# Patient Record
Sex: Female | Born: 1970
Health system: Southern US, Community
[De-identification: ages and names within clinical notes are randomized; demographics above are authoritative.]

## PROBLEM LIST (undated history)

## (undated) DIAGNOSIS — E669 Obesity, unspecified: Secondary | ICD-10-CM

## (undated) DIAGNOSIS — I10 Essential (primary) hypertension: Secondary | ICD-10-CM

## (undated) DIAGNOSIS — I639 Cerebral infarction, unspecified: Secondary | ICD-10-CM

## (undated) HISTORY — DX: Cerebral infarction, unspecified: I63.9

## (undated) HISTORY — PX: TUBAL LIGATION: SHX77

## (undated) HISTORY — PX: NOVASURE ABLATION: SHX5394

## (undated) HISTORY — DX: Obesity, unspecified: E66.9

---

## 1997-06-08 ENCOUNTER — Encounter (HOSPITAL_COMMUNITY): Admission: RE | Admit: 1997-06-08 | Discharge: 1997-09-06 | Payer: Self-pay | Admitting: *Deleted

## 1997-06-22 ENCOUNTER — Inpatient Hospital Stay (HOSPITAL_COMMUNITY): Admission: AD | Admit: 1997-06-22 | Discharge: 1997-06-25 | Payer: Self-pay | Admitting: Obstetrics

## 1997-09-07 ENCOUNTER — Encounter (HOSPITAL_COMMUNITY): Admission: RE | Admit: 1997-09-07 | Discharge: 1997-11-29 | Payer: Self-pay | Admitting: *Deleted

## 1997-11-03 ENCOUNTER — Inpatient Hospital Stay (HOSPITAL_COMMUNITY): Admission: AD | Admit: 1997-11-03 | Discharge: 1997-11-03 | Payer: Self-pay | Admitting: Obstetrics

## 1997-11-12 ENCOUNTER — Ambulatory Visit (HOSPITAL_COMMUNITY): Admission: RE | Admit: 1997-11-12 | Discharge: 1997-11-12 | Payer: Self-pay | Admitting: *Deleted

## 1997-11-27 ENCOUNTER — Inpatient Hospital Stay (HOSPITAL_COMMUNITY): Admission: AD | Admit: 1997-11-27 | Discharge: 1997-11-30 | Payer: Self-pay | Admitting: *Deleted

## 1998-01-11 ENCOUNTER — Inpatient Hospital Stay (HOSPITAL_COMMUNITY): Admission: AD | Admit: 1998-01-11 | Discharge: 1998-01-11 | Payer: Self-pay | Admitting: *Deleted

## 1998-04-27 ENCOUNTER — Inpatient Hospital Stay (HOSPITAL_COMMUNITY): Admission: AD | Admit: 1998-04-27 | Discharge: 1998-04-27 | Payer: Self-pay | Admitting: Obstetrics

## 1998-10-25 ENCOUNTER — Encounter (INDEPENDENT_AMBULATORY_CARE_PROVIDER_SITE_OTHER): Payer: Self-pay

## 1998-10-25 ENCOUNTER — Inpatient Hospital Stay (HOSPITAL_COMMUNITY): Admission: AD | Admit: 1998-10-25 | Discharge: 1998-10-25 | Payer: Self-pay | Admitting: *Deleted

## 1999-01-10 ENCOUNTER — Emergency Department (HOSPITAL_COMMUNITY): Admission: EM | Admit: 1999-01-10 | Discharge: 1999-01-10 | Payer: Self-pay | Admitting: Emergency Medicine

## 1999-05-18 ENCOUNTER — Emergency Department (HOSPITAL_COMMUNITY): Admission: EM | Admit: 1999-05-18 | Discharge: 1999-05-18 | Payer: Self-pay | Admitting: Emergency Medicine

## 1999-06-15 ENCOUNTER — Emergency Department (HOSPITAL_COMMUNITY): Admission: EM | Admit: 1999-06-15 | Discharge: 1999-06-15 | Payer: Self-pay

## 1999-07-13 ENCOUNTER — Emergency Department (HOSPITAL_COMMUNITY): Admission: EM | Admit: 1999-07-13 | Discharge: 1999-07-13 | Payer: Self-pay | Admitting: Emergency Medicine

## 2000-01-26 ENCOUNTER — Encounter (INDEPENDENT_AMBULATORY_CARE_PROVIDER_SITE_OTHER): Payer: Self-pay | Admitting: Specialist

## 2000-01-26 ENCOUNTER — Inpatient Hospital Stay (HOSPITAL_COMMUNITY): Admission: AD | Admit: 2000-01-26 | Discharge: 2000-01-26 | Payer: Self-pay | Admitting: *Deleted

## 2000-02-22 ENCOUNTER — Inpatient Hospital Stay (HOSPITAL_COMMUNITY): Admission: AD | Admit: 2000-02-22 | Discharge: 2000-02-22 | Payer: Self-pay | Admitting: Obstetrics & Gynecology

## 2000-03-23 ENCOUNTER — Emergency Department (HOSPITAL_COMMUNITY): Admission: EM | Admit: 2000-03-23 | Discharge: 2000-03-23 | Payer: Self-pay | Admitting: Emergency Medicine

## 2000-06-30 ENCOUNTER — Inpatient Hospital Stay (HOSPITAL_COMMUNITY): Admission: AD | Admit: 2000-06-30 | Discharge: 2000-06-30 | Payer: Self-pay | Admitting: *Deleted

## 2001-01-02 ENCOUNTER — Emergency Department (HOSPITAL_COMMUNITY): Admission: EM | Admit: 2001-01-02 | Discharge: 2001-01-03 | Payer: Self-pay

## 2001-07-08 ENCOUNTER — Inpatient Hospital Stay (HOSPITAL_COMMUNITY): Admission: AD | Admit: 2001-07-08 | Discharge: 2001-07-08 | Payer: Self-pay | Admitting: *Deleted

## 2001-07-08 ENCOUNTER — Encounter (INDEPENDENT_AMBULATORY_CARE_PROVIDER_SITE_OTHER): Payer: Self-pay

## 2003-02-28 ENCOUNTER — Emergency Department (HOSPITAL_COMMUNITY): Admission: EM | Admit: 2003-02-28 | Discharge: 2003-02-28 | Payer: Self-pay | Admitting: Emergency Medicine

## 2004-04-10 ENCOUNTER — Emergency Department (HOSPITAL_COMMUNITY): Admission: EM | Admit: 2004-04-10 | Discharge: 2004-04-10 | Payer: Self-pay | Admitting: Emergency Medicine

## 2005-04-25 ENCOUNTER — Emergency Department (HOSPITAL_COMMUNITY): Admission: EM | Admit: 2005-04-25 | Discharge: 2005-04-25 | Payer: Self-pay | Admitting: Emergency Medicine

## 2005-11-01 ENCOUNTER — Encounter: Admission: RE | Admit: 2005-11-01 | Discharge: 2005-11-01 | Payer: Self-pay | Admitting: Occupational Medicine

## 2007-10-29 ENCOUNTER — Other Ambulatory Visit: Admission: RE | Admit: 2007-10-29 | Discharge: 2007-10-29 | Payer: Self-pay | Admitting: Family Medicine

## 2009-07-05 ENCOUNTER — Emergency Department (HOSPITAL_COMMUNITY): Admission: EM | Admit: 2009-07-05 | Discharge: 2009-07-05 | Payer: Self-pay | Admitting: Emergency Medicine

## 2011-05-08 DIAGNOSIS — I10 Essential (primary) hypertension: Secondary | ICD-10-CM

## 2011-05-08 DIAGNOSIS — I639 Cerebral infarction, unspecified: Secondary | ICD-10-CM

## 2011-05-08 HISTORY — DX: Cerebral infarction, unspecified: I63.9

## 2011-05-08 HISTORY — DX: Essential (primary) hypertension: I10

## 2011-08-25 ENCOUNTER — Inpatient Hospital Stay (HOSPITAL_COMMUNITY): Payer: 59

## 2011-08-25 ENCOUNTER — Emergency Department (HOSPITAL_COMMUNITY): Payer: 59

## 2011-08-25 ENCOUNTER — Encounter (HOSPITAL_COMMUNITY): Payer: Self-pay | Admitting: Emergency Medicine

## 2011-08-25 ENCOUNTER — Inpatient Hospital Stay (HOSPITAL_COMMUNITY)
Admission: EM | Admit: 2011-08-25 | Discharge: 2011-08-27 | DRG: 065 | Disposition: A | Discharge status: Home / Self Care | Payer: 59 | Attending: Neurology | Admitting: Neurology

## 2011-08-25 DIAGNOSIS — I169 Hypertensive crisis, unspecified: Secondary | ICD-10-CM

## 2011-08-25 DIAGNOSIS — Z6841 Body Mass Index (BMI) 40.0 and over, adult: Secondary | ICD-10-CM

## 2011-08-25 DIAGNOSIS — I10 Essential (primary) hypertension: Secondary | ICD-10-CM | POA: Diagnosis present

## 2011-08-25 DIAGNOSIS — I619 Nontraumatic intracerebral hemorrhage, unspecified: Principal | ICD-10-CM | POA: Diagnosis present

## 2011-08-25 DIAGNOSIS — E66813 Obesity, class 3: Secondary | ICD-10-CM | POA: Diagnosis present

## 2011-08-25 HISTORY — DX: Essential (primary) hypertension: I10

## 2011-08-25 LAB — COMPREHENSIVE METABOLIC PANEL
CO2: 29 mEq/L (ref 19–32)
Calcium: 9.6 mg/dL (ref 8.4–10.5)
Creatinine, Ser: 0.83 mg/dL (ref 0.50–1.10)
GFR calc Af Amer: >90 mL/min (ref >90)
GFR calc non Af Amer: 86 mL/min — ABNORMAL LOW (ref >90)
Glucose, Bld: 90 mg/dL (ref 70–99)
Total Protein: 8 g/dL (ref 6.0–8.3)

## 2011-08-25 LAB — DIFFERENTIAL
Eosinophils Absolute: 0.2 10*3/uL (ref 0.0–0.7)
Eosinophils Relative: 4 % (ref 0–5)
Lymphocytes Relative: 35 % (ref 12–46)
Lymphs Abs: 2 10*3/uL (ref 0.7–4.0)
Monocytes Absolute: 0.3 10*3/uL (ref 0.1–1.0)

## 2011-08-25 LAB — RAPID URINE DRUG SCREEN, HOSP PERFORMED: Barbiturates: NOT DETECTED

## 2011-08-25 LAB — GLUCOSE, CAPILLARY: Glucose-Capillary: 78 mg/dL (ref 70–99)

## 2011-08-25 LAB — CBC
HCT: 41.4 % (ref 36.0–46.0)
MCH: 28 pg (ref 26.0–34.0)
MCV: 83.5 fL (ref 78.0–100.0)
RBC: 4.96 MIL/uL (ref 3.87–5.11)
RDW: 14.1 % (ref 11.5–15.5)
WBC: 5.7 10*3/uL (ref 4.0–10.5)

## 2011-08-25 LAB — MRSA PCR SCREENING: MRSA by PCR: NEGATIVE

## 2011-08-25 LAB — CK TOTAL AND CKMB (NOT AT ARMC)
CK, MB: 4.5 ng/mL — ABNORMAL HIGH (ref 0.3–4.0)
Relative Index: 1.6 (ref 0.0–2.5)
Total CK: 281 U/L — ABNORMAL HIGH (ref 7–177)

## 2011-08-25 LAB — POCT I-STAT, CHEM 8
BUN: 9 mg/dL (ref 6–23)
Calcium, Ion: 1.23 mmol/L (ref 1.12–1.32)
Chloride: 102 mEq/L (ref 96–112)
Creatinine, Ser: 0.9 mg/dL (ref 0.50–1.10)
Glucose, Bld: 97 mg/dL (ref 70–99)
Potassium: 3.9 mEq/L (ref 3.5–5.1)

## 2011-08-25 LAB — PROTIME-INR: Prothrombin Time: 14.8 seconds (ref 11.6–15.2)

## 2011-08-25 MED ORDER — PANTOPRAZOLE SODIUM 40 MG IV SOLR
40.0000 mg | Freq: Every day | INTRAVENOUS | Status: DC
  Filled 2011-08-25: qty 40

## 2011-08-25 MED ORDER — STROKE: EARLY STAGES OF RECOVERY BOOK
Freq: Once | Status: AC
  Administered 2011-08-25: 14:00:00
  Filled 2011-08-25: qty 1

## 2011-08-25 MED ORDER — LABETALOL HCL 5 MG/ML IV SOLN
INTRAVENOUS | Status: AC
  Administered 2011-08-25: 10 mg via INTRAVENOUS
  Filled 2011-08-25: qty 4

## 2011-08-25 MED ORDER — SENNOSIDES-DOCUSATE SODIUM 8.6-50 MG PO TABS
1.0000 | ORAL_TABLET | Freq: Two times a day (BID) | ORAL | Status: DC
  Administered 2011-08-26 – 2011-08-27 (×2): 1 via ORAL
  Filled 2011-08-25 (×7): qty 1

## 2011-08-25 MED ORDER — ACETAMINOPHEN 650 MG RE SUPP: 650.0000 mg | RECTAL | Status: DC | PRN

## 2011-08-25 MED ORDER — NICARDIPINE HCL IN NACL 20-0.86 MG/200ML-% IV SOLN
5.0000 mg/h | INTRAVENOUS | Status: DC
  Administered 2011-08-25: 5 mg/h via INTRAVENOUS
  Filled 2011-08-25 (×2): qty 200

## 2011-08-25 MED ORDER — SODIUM CHLORIDE 0.9 % IV SOLN
INTRAVENOUS | Status: DC
  Administered 2011-08-25: 14:00:00 via INTRAVENOUS

## 2011-08-25 MED ORDER — LABETALOL HCL 5 MG/ML IV SOLN
10.0000 mg | INTRAVENOUS | Status: DC | PRN
  Administered 2011-08-25: 20 mg via INTRAVENOUS
  Administered 2011-08-25 (×2): 10 mg via INTRAVENOUS
  Filled 2011-08-25: qty 4

## 2011-08-25 MED ORDER — PANTOPRAZOLE SODIUM 40 MG PO TBEC
40.0000 mg | DELAYED_RELEASE_TABLET | Freq: Every day | ORAL | Status: DC
  Administered 2011-08-25 – 2011-08-26 (×2): 40 mg via ORAL
  Filled 2011-08-25 (×3): qty 1

## 2011-08-25 MED ORDER — ACETAMINOPHEN 325 MG PO TABS: 650.0000 mg | ORAL_TABLET | ORAL | Status: DC | PRN

## 2011-08-25 MED ORDER — ONDANSETRON HCL 4 MG/2ML IJ SOLN: 4.0000 mg | Freq: Four times a day (QID) | INTRAMUSCULAR | Status: DC | PRN

## 2011-08-25 MED ORDER — LORAZEPAM 2 MG/ML IJ SOLN
2.0000 mg | Freq: Once | INTRAMUSCULAR | Status: AC
  Administered 2011-08-25: 2 mg via INTRAVENOUS

## 2011-08-25 NOTE — ED Notes (Signed)
Patient transported to CT by Maury Dus and Angelique Blonder RN

## 2011-08-25 NOTE — ED Notes (Addendum)
Pt reports waking this am w/generalized weakness, states "I don't feel myself, I almost feel like I'm drunk." Pt denies dizziness, chest pain, sob. Pt reports increase weakness on (L) side, pt states "I feel like I'm talking slower than normal." Pt reports her BP was higher today than normal, reports she kept bumping into things, and another staff member told her she looked like she didn't feel well. No facial droop, some (L) arm drift, no leg drift, a/o x4

## 2011-08-25 NOTE — ED Notes (Signed)
Patient denies pain and is resting comfortably.  

## 2011-08-25 NOTE — Progress Notes (Signed)
PT Cancellation Note  Treatment cancelled today due to medical issues with patient which prohibited therapy. Pt is currently still on bedrest. Will attempt evaluation tomorrow pending medical stability.  Thank you, 08/25/2011 Milana Kidney DPT PAGER: [phone] OFFICE: [phone]    Milana Kidney 08/25/2011, 1:21 PM

## 2011-08-25 NOTE — ED Notes (Signed)
MD at bedside. Dr. Stewart 

## 2011-08-25 NOTE — ED Notes (Signed)
Patient is resting comfortably. Pt friend and aunt called per patient request.  Swallow screen passed.

## 2011-08-25 NOTE — ED Notes (Addendum)
Pt reports upon arrival to work at 0900 had dizziness, high blood pressure, and left sided weakness. Pt noted to have bandage on right AC. Pt reports had blood drawn in lab downstairs, trying to determine what could be going on.

## 2011-08-25 NOTE — ED Provider Notes (Addendum)
History     CSN: [id number]  Arrival date & time 08/25/11  1038   First MD Initiated Contact with Patient 08/25/11 1051      Chief Complaint  Patient presents with  . Dizziness  . Hypertension  . Numbness    left sided    (Consider location/radiation/quality/duration/timing/severity/associated sxs/prior treatment) HPI  40yoF h/o HTN pw dizziness, Lt weakness. She states that she woke up this morning "not feeling right". She states that when she came to work approximately an hour ago she began to feel lightheaded and "off balance". She states that someone told her she was not talking normally at that time. She states that her left upper extremity began to have tingling sensation as well as numbness in her left lower extremity felt heavy. She denies history of similar. She denies chest pain, shortness of breath, palpitations. She denies anticoagulant use. She states she's been compliant with her bistolic and Benicar but her blood pressure was noted to be high just prior to arrival when checked by co workers.  ED Notes, ED Provider Notes from 08/25/11 0000 to 08/25/11 10:43:43       Keshia Tommy Rainwater, RN 08/25/2011 10:42      Pt reports upon arrival to work at 0900 had dizziness, high blood pressure, and left sided weakness.     Past Medical History  Diagnosis Date  . Hypertension     History reviewed. No pertinent past surgical history.  History reviewed. No pertinent family history.  History  Substance Use Topics  . Smoking status: Never Smoker   . Smokeless tobacco: Not on file  . Alcohol Use: No    OB History    Grav Para Term Preterm Abortions TAB SAB Ect Mult Living                  Review of Systems  All other systems reviewed and are negative.   except as noted HPI   Allergies  Review of patient's allergies indicates no known allergies.  Home Medications   Current Outpatient Rx  Name Route Sig Dispense Refill  . CETIRIZINE HCL 10 MG PO TABS Oral Take  10 mg by mouth daily.    . NEBIVOLOL HCL 10 MG PO TABS Oral Take 10 mg by mouth daily.    Marland Kitchen OLMESARTAN MEDOXOMIL-HCTZ 40-25 MG PO TABS Oral Take 1 tablet by mouth daily.      BP 170/142  Pulse 77  Temp(Src) 97.8 F (36.6 C) (Oral)  Resp 16  SpO2 100%  LMP 08/05/2011  Physical Exam  Nursing note and vitals reviewed. Constitutional: She is oriented to person, place, and time. She appears well-developed.  HENT:  Head: Atraumatic.  Mouth/Throat: Oropharynx is clear and moist.  Eyes: Conjunctivae and EOM are normal. Pupils are equal, round, and reactive to light.  Neck: Normal range of motion. Neck supple.  Cardiovascular: Normal rate, regular rhythm, normal heart sounds and intact distal pulses.   Pulmonary/Chest: Effort normal and breath sounds normal. No respiratory distress. She has no wheezes. She has no rales.  Abdominal: Soft. She exhibits no distension. There is no tenderness. There is no rebound and no guarding.  Musculoskeletal: Normal range of motion.  Neurological: She is alert and oriented to person, place, and time.       Strength 4+/5 LLE otherwise 5/5 Gross sensation intact No pronator drift No facial droop   Skin: Skin is warm and dry. No rash noted.  Psychiatric: She has a normal mood and affect.  Date: 08/25/2011  Rate: 86  Rhythm: normal sinus rhythm  QRS Axis: normal  Intervals: normal  ST/T Wave abnormalities: normal  Conduction Disutrbances:none  Narrative Interpretation: septal infarct, age determined (noted prior ecg)  Old EKG Reviewed: no significant change noted    ED Course  Procedures (including critical care time)  CRITICAL CARE Performed by: Forbes Cellar   Total critical care time:  Critical care time was exclusive of separately billable procedures and treating other patients.  Critical care was necessary to treat or prevent imminent or life-threatening deterioration.  Critical care was time spent personally by me on the  following activities: development of treatment plan with patient and/or surrogate as well as nursing, discussions with consultants, evaluation of patient's response to treatment, examination of patient, obtaining history from patient or surrogate, ordering and performing treatments and interventions, ordering and review of laboratory studies, ordering and review of radiographic studies, pulse oximetry and re-evaluation of patient's condition.    Labs Reviewed  POCT I-STAT, CHEM 8  CBC  DIFFERENTIAL  PROTIME-INR  APTT  COMPREHENSIVE METABOLIC PANEL  CK TOTAL AND CKMB  TROPONIN I  URINE RAPID DRUG SCREEN (HOSP PERFORMED)   Ct Head Wo Contrast  08/25/2011  *RADIOLOGY REPORT*  Clinical Data: Dizziness with slurred speech, and left arm and left leg weakness. History of hypertension.  CT HEAD WITHOUT CONTRAST  Technique:  Contiguous axial images were obtained from the base of the skull through the vertex without contrast.  Comparison: None.  Findings: 15 x 16 mm cross-section right thalamic acute hemorrhage, with slight surrounding halo of edema. Assuming the lesion is roughly spherical, the volume of this hematoma is approximately 1.5 - 2.0 ml.  In this patient with documented hypertension, the location, as well as the lack of subarachnoid hemorrhage, suggests that this is a typical hypertensive related bleed.  Other considerations would include a cavernoma, excessive anticoagulation, unrecognized trauma, vascular malformation, or cerebral amyloid angiopathy.  No significant midline shift.  No intraventricular extension.  No cortical infarct.  No CT signs of proximal vascular thrombosis.  Calvarium intact. Clear sinuses and mastoids.  IMPRESSION: 15 x 16 mm right thalamic hemorrhage with slight surrounding edema consistent with acute hypertensive bleed.  Critical Value/emergent results were called by telephone at the time of interpretation on 08/25/2011  at 11:15 a.m.  to  Dr. Roseanne Reno, who verbally  acknowledged these results.  Original Report Authenticated By: Elsie Stain, M.D.     1. Hypertensive crisis   2. Thalamic hemorrhage with stroke     MDM  Patient initially code stroke, cancelled by neurology. Will admit to their service, ICU. Labetalol ordered by them. Continue to monitor blood pressure.         Forbes Cellar, MD 08/25/11 1150  Forbes Cellar, MD 08/25/11 1150

## 2011-08-25 NOTE — H&P (Signed)
Admission H&P    Chief Complaint: Sudden onset of unsteadiness and left-sided heaviness as well as slight slurring of speech.  HPI: Kristy Vasquez is an 41 y.o. female history of hypertension presenting with sudden onset of slight speech difficulty, feeling of unsteadiness and feeling of heaviness involving the left extremities. Onset was at 8:59 AM today. She has no previous history of stroke or TIA. She has not been on antiplatelet therapy. CT scan of her head showed an acute small right thalamic hemorrhage without significant mass effect. Blood pressure was 181/103. NIH stroke score was 2.  LSN: A 50 9 AM today tPA Given: No: Thalamic hemorrhage MRankin: 0  Past Medical History  Diagnosis Date  . Hypertension     History reviewed. No pertinent past surgical history.  History reviewed. No pertinent family history. Social History:  reports that she has never smoked. She does not have any smokeless tobacco history on file. She reports that she does not drink alcohol or use illicit drugs.  Allergies: No Known Allergies  Medications Prior to Admission  Medication Dose Route Frequency Provider Last Rate Last Dose  . labetalol (NORMODYNE,TRANDATE) 5 MG/ML injection            Medications Prior to Admission  Medication Sig Dispense Refill  . cetirizine (ZYRTEC) 10 MG tablet Take 10 mg by mouth daily.      . nebivolol (BYSTOLIC) 10 MG tablet Take 10 mg by mouth daily.      Marland Kitchen olmesartan-hydrochlorothiazide (BENICAR HCT) 40-25 MG per tablet Take 1 tablet by mouth daily.        ROS: History obtained from the patient  General ROS: negative Psychological ROS: negative Ophthalmic ROS: negative ENT ROS: negative Allergy and Immunology ROS: Controlled with Zyrtec Hematological and Lymphatic ROS: negative Endocrine ROS: negative Respiratory ROS: no cough, shortness of breath, or wheezing Cardiovascular ROS: no chest pain or dyspnea on exertion Gastrointestinal ROS: no abdominal pain,  change in bowel habits, or black or bloody stools Genito-Urinary ROS: negative Musculoskeletal ROS: negative Neurological ROS: Per present illness Dermatological ROS: negative   Physical Examination: Blood pressure 181/103, pulse 77, temperature 97.8 F (36.6 C), temperature source Oral, resp. rate 20, last menstrual period 08/05/2011, SpO2 100.00%.  HEENT-  Normocephalic, no lesions, without obvious abnormality.  Normal external eye and conjunctiva.  Normal TM's bilaterally.  Normal auditory canals and external ears. Normal external nose, mucus membranes and septum.  Normal pharynx. Neck supple with no masses, nodes, nodules or enlargement. Cardiovascular - regular rate and rhythm, S1, S2 normal, no murmur, click, rub or gallop Lungs - chest clear, no wheezing, rales, normal symmetric air entry, Heart exam - S1, S2 normal, no murmur, no gallop, rate regular Abdomen - soft, non-tender; bowel sounds normal; no masses,  no organomegaly Extremities - no joint deformities, effusion, or inflammation, no edema and no skin discoloration  Neurologic Examination: Mental Status: Alert, oriented, thought content appropriate.  Speech fluent without evidence of aphasia. Able to follow commands without difficulty. Cranial Nerves: II-Visual fields were normal. III/IV/VI-Pupils were equal and reacted. Extraocular movements were full and conjugate.    V/VII-no facial numbness and no facial weakness. VIII-normal. X-normal speech. XII-midline tongue extension Motor: 5/5 bilaterally with normal tone and bulk except for minimal drift of left upper extremity with extension. Sensory: Slightly reduced tactile sensation involving left upper extremity. Deep Tendon Reflexes: 2+ and symmetric. Plantars: Flexor bilaterally Cerebellar: Normal finger-to-nose and heel-to-shin testing. Carotid auscultation: Normal   Results for orders placed during the  hospital encounter of 08/25/11 (from the past 48 hour(s))    POCT I-STAT, CHEM 8     Status: Normal   Collection Time   08/25/11 11:04 AM      Component Value Range Comment   Sodium 141  135 - 145 (mEq/L)    Potassium 3.9  3.5 - 5.1 (mEq/L)    Chloride 102  96 - 112 (mEq/L)    BUN 9  6 - 23 (mg/dL)    Creatinine, Ser 1.61  0.50 - 1.10 (mg/dL)    Glucose, Bld 97  70 - 99 (mg/dL)    Calcium, Ion 0.96  1.12 - 1.32 (mmol/L)    TCO2 29  0 - 100 (mmol/L)    Hemoglobin 15.0  12.0 - 15.0 (g/dL)    HCT 04.5  40.9 - 81.1 (%)    Ct Head Wo Contrast  08/25/2011  *RADIOLOGY REPORT*  Clinical Data: Dizziness with slurred speech, and left arm and left leg weakness. History of hypertension.  CT HEAD WITHOUT CONTRAST  Technique:  Contiguous axial images were obtained from the base of the skull through the vertex without contrast.  Comparison: None.  Findings: 15 x 16 mm cross-section right thalamic acute hemorrhage, with slight surrounding halo of edema. Assuming the lesion is roughly spherical, the volume of this hematoma is approximately 1.5 - 2.0 ml.  In this patient with documented hypertension, the location, as well as the lack of subarachnoid hemorrhage, suggests that this is a typical hypertensive related bleed.  Other considerations would include a cavernoma, excessive anticoagulation, unrecognized trauma, vascular malformation, or cerebral amyloid angiopathy.  No significant midline shift.  No intraventricular extension.  No cortical infarct.  No CT signs of proximal vascular thrombosis.  Calvarium intact. Clear sinuses and mastoids.  IMPRESSION: 15 x 16 mm right thalamic hemorrhage with slight surrounding edema consistent with acute hypertensive bleed.  Critical Value/emergent results were called by telephone at the time of interpretation on 08/25/2011  at 11:15 a.m.  to  Dr. Roseanne Reno, who verbally acknowledged these results.  Original Report Authenticated By: Elsie Stain, M.D.    Assessment: 41 y.o. female acute small right thalamic hemorrhage most likely  secondary to hypertension.  Stroke Risk Factors - hypertension  Plan: 1. HgbA1c, fasting lipid panel 2. MRI, MRA  of the brain without contrast 3. PT consult, OT consult, Speech consult 4. Echocardiogram 5. Carotid dopplers 6. Prophylactic therapy-None 7. Risk factor modification 7. Telemetry monitoring  C.R. Roseanne Reno, MD [facility] [phone] 08/25/2011, 11:36 AM

## 2011-08-25 NOTE — Code Documentation (Signed)
Code stroke called at 1051, Patient arrived at 78, stroke team arrived at 39, Patient in CT at 13, Lab at 34, Ct read 1112.  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  Co-Workers noticed slurred speech and  sent her to ED.  NIHSS 2.  Cancelled at 1120

## 2011-08-26 MED ORDER — NEBIVOLOL HCL 10 MG PO TABS
10.0000 mg | ORAL_TABLET | Freq: Every day | ORAL | Status: DC
  Administered 2011-08-26 – 2011-08-27 (×2): 10 mg via ORAL
  Filled 2011-08-26 (×2): qty 1

## 2011-08-26 MED ORDER — LISINOPRIL 10 MG PO TABS
10.0000 mg | ORAL_TABLET | Freq: Every day | ORAL | Status: DC
  Administered 2011-08-26 – 2011-08-27 (×2): 10 mg via ORAL
  Filled 2011-08-26 (×2): qty 1

## 2011-08-26 NOTE — Evaluation (Signed)
Speech Language Pathology Evaluation Patient Details Name: Kristy Vasquez MRN: [id number] DOB: Sep 12, 1970 Today's Date: 08/26/2011  Problem List: There is no problem list on file for this patient.  Past Medical History:  Past Medical History  Diagnosis Date  . Hypertension    Past Surgical History:  Past Surgical History  Procedure Date  . Tubal ligation     SLP Assessment/Plan/Recommendation Assessment: 41 y/o female referred for Cognitive-Linguistic Evaluation per stroke protocol. Patient with no prior history of aphasia or cognitive deficits.  Clinical Impression Statement: Receptive and expressive language skills baseline with no deficits indicated.  Minimal cognitive dificits in areas of complex problem solving with no self awareness of deficits.  Cognitive skills functional in acute care setting.  Patient may benefit from ST evaluation in out patient setting for complex problem solving to return to employment .   SLP Recommendation/Assessment: All further Speech Lanaguage Pathology  needs can be addressed in the next venue of care SLP Recommendations Follow up Recommendations: Outpatient SLP Equipment Recommended: None recommended by OT;None recommended by PT      SLP Evaluation Prior Functioning Cognitive/Linguistic Baseline: Within functional limits Type of Home: House Lives With: Daughter Available Help at Discharge: Family Education: 12th grade education with some community college credit Vocation: Full time employment  Cognition Overall Cognitive Status: Impaired Arousal/Alertness: Awake/alert Orientation Level: Oriented X4 Attention: Focused Focused Attention: Impaired Focused Attention Impairment: Functional complex;Verbal complex Memory: Appears intact Awareness: Impaired Awareness Impairment: Intellectual impairment;Emergent impairment;Anticipatory impairment Problem Solving: Impaired Problem Solving Impairment: Verbal complex;Functional  complex Executive Function: Reasoning;Self Correcting Reasoning: Impaired Reasoning Impairment: Verbal complex;Functional complex Self Correcting: Impaired Behaviors: Poor frustration tolerance  Comprehension Auditory Comprehension Overall Auditory Comprehension: Impaired Yes/No Questions: Impaired Interfering Components: Processing speed EffectiveTechniques: Extra processing time;Increased volume;Repetition Visual Recognition/Discrimination Discrimination: Within Function Limits Reading Comprehension Reading Status: Within funtional limits  Expression Expression Primary Mode of Expression: Verbal Verbal Expression Overall Verbal Expression: Appears within functional limits for tasks assessed Written Expression Dominant Hand: Right Written Expression: Not tested  Oral/Motor Oral Motor/Sensory Function Overall Oral Motor/Sensory Function: Appears within functional limits for tasks assessed Motor Speech Overall Motor Speech: Appears within functional limits for tasks assessed Moreen Fowler MS, CCC-SLP [phone]  [facility] 08/26/2011, 5:38 PM

## 2011-08-26 NOTE — Evaluation (Signed)
Physical Therapy Evaluation Patient Details Name: Kristy Vasquez MRN: [id number] DOB: 02-10-1971 Today's Date: 08/26/2011  PT Assessment / Plan / Recommendation Clinical Impression  Pt presents with a medical diagnosis of CVA with high level balance deficits and strength deficits. Pt will benefit from skilled PT in the acute care setting in order to improve functional mobility. Pt needs to be mod I upon d/c.    PT Assessment  Patient needs continued PT services    Follow Up Recommendations  Outpatient PT;Supervision - Intermittent    Equipment Recommendations  None recommended by OT;None recommended by PT    Frequency Min 4X/week    Precautions / Restrictions Restrictions Weight Bearing Restrictions: No   Pertinent Vitals/Pain BP and HR maintained throughout session.      Mobility  Bed Mobility Bed Mobility: Sit to Supine Sit to Supine: 6: Modified independent (Device/Increase time);HOB elevated Transfers Transfers: Sit to Stand;Stand to Sit Sit to Stand: 4: Min guard;From chair/3-in-1;With armrests;With upper extremity assist;From toilet Stand to Sit: 4: Min guard;To bed;To toilet;To chair/3-in-1;With armrests;With upper extremity assist Details for Transfer Assistance: Minguard for safety secondary to slight unsteadiness upon standing Ambulation/Gait Ambulation/Gait Assistance: 4: Min guard Ambulation Distance (Feet): 100 Feet Assistive device: None Ambulation/Gait Assistance Details: Minguard assist secondary to slight unsteadiness and pt with feeling of "heaviness" in LEs Gait Pattern: Step-to pattern;Decreased stride length;Decreased hip/knee flexion - right;Trunk flexed Gait velocity: decreased gait speed Modified Rankin (Stroke Patients Only) Modified Rankin: Moderate disability    Exercises     PT Goals Acute Rehab PT Goals PT Goal Formulation: With patient Time For Goal Achievement: 09/02/11 Potential to Achieve Goals: Good Pt will go Sit to Stand: with  modified independence PT Goal: Sit to Stand - Progress: Goal set today Pt will go Stand to Sit: with modified independence PT Goal: Stand to Sit - Progress: Goal set today Pt will Transfer Bed to Chair/Chair to Bed: with modified independence PT Transfer Goal: Bed to Chair/Chair to Bed - Progress: Goal set today Pt will Ambulate: >150 feet;with modified independence PT Goal: Ambulate - Progress: Goal set today Additional Goals Additional Goal #1: Pt will be able to recall stroke warning signs as well as lifestyle modification PT Goal: Additional Goal #1 - Progress: Goal set today  Visit Information  Assistance Needed: +1 PT/OT Co-Evaluation/Treatment: Yes    Subjective Data      Prior Functioning  Home Living Lives With: Daughter Available Help at Discharge: Family;Available 24 hours/day Type of Home: House Home Access: Level entry Home Layout: One level Bathroom Shower/Tub: Tub/shower unit;Curtain Firefighter: Standard Bathroom Accessibility: Yes How Accessible: Accessible via walker Home Adaptive Equipment: None Prior Function Level of Independence: Independent Able to Take Stairs?: Yes Driving: Yes Vocation: Full time employment Communication Communication: No difficulties Dominant Hand: Right    Cognition  Overall Cognitive Status: Appears within functional limits for tasks assessed/performed Arousal/Alertness: Awake/alert Orientation Level: Oriented X4 / Intact Behavior During Session: [facility] for tasks performed Cognition - Other Comments: While pt's cognition was [facility] for tasks assessed, but pt slow to process information.  Possible deficits with higher level congnitive tasks.    Extremity/Trunk Assessment Right Upper Extremity Assessment RUE ROM/Strength/Tone: Within functional levels RUE Sensation: WFL - Light Touch;WFL - Proprioception RUE Coordination: WFL - gross/fine motor Left Upper Extremity Assessment LUE ROM/Strength/Tone: Deficits LUE  ROM/Strength/Tone Deficits: Overall strength 3+/5 LUE Sensation: Deficits LUE Sensation Deficits: Impaired sensation in LUE. LUE Coordination: Deficits LUE Coordination Deficits: Decreased gross/fine motor coordinat Right Lower Extremity Assessment  RLE ROM/Strength/Tone: Within functional levels RLE Sensation: WFL - Light Touch RLE Coordination: WFL - gross/fine motor Left Lower Extremity Assessment LLE ROM/Strength/Tone: Deficits LLE ROM/Strength/Tone Deficits: Overall 4/5 LLE Sensation: Deficits LLE Sensation Deficits: decreased sensation. decreased impairment LLE Coordination: WFL - gross/fine motor   Balance    End of Session PT - End of Session Equipment Utilized During Treatment: Gait belt Activity Tolerance: Patient tolerated treatment well Patient left: in bed;with call bell/phone within reach Nurse Communication: Mobility status   Milana Kidney 08/26/2011, 1:16 PM  08/26/2011 Milana Kidney DPT PAGER: [phone] OFFICE: [phone]

## 2011-08-26 NOTE — Progress Notes (Signed)
HPI: Kristy Vasquez is an 41 y.o. female history of hypertension presenting with sudden onset of slight speech difficulty, feeling of unsteadiness and feeling of heaviness involving the left extremities. Onset was at 8:59 AM  08/25/2011. She has no previous history of stroke or TIA. She has not been on antiplatelet therapy. CT scan of her head showed an acute small right thalamic hemorrhage without significant mass effect. Blood pressure was 181/103. NIH stroke score was 2. MRI confirmed right thalamic bleeding 1.5cm (estimated volume 1.5-2 ml), without associated subarachnoid or intraventricular blood.  LSN: A 50 9 AM today tPA Given: No: Thalamic hemorrhage MRankin: 0  Past Medical History  Diagnosis Date  . Hypertension     Past Surgical History  Procedure Date  . Tubal ligation     History reviewed. No pertinent family history. Social History:  reports that she has never smoked. She does not have any smokeless tobacco history on file. She reports that she does not drink alcohol or use illicit drugs.  Allergies: No Known Allergies  Medications Prior to Admission  Medication Dose Route Frequency Provider Last Rate Last Dose  .  stroke: mapping our early stages of recovery book   Does not apply Once Noel Christmas      . acetaminophen (TYLENOL) tablet 650 mg  650 mg Oral Q4H PRN Noel Christmas       Or  . acetaminophen (TYLENOL) suppository 650 mg  650 mg Rectal Q4H PRN Noel Christmas      . labetalol (NORMODYNE,TRANDATE) injection 10-40 mg  10-40 mg Intravenous Q10 min PRN Noel Christmas   20 mg at 08/25/11 1828  . LORazepam (ATIVAN) injection 2 mg  2 mg Intravenous Once Carmell Austria, MD   2 mg at 08/25/11 1519  . niCARdipine (CARDENE-IV) infusion (0.1 mg/ml)  5 mg/hr Intravenous Titrated Noel Christmas   1 mg/hr at 08/25/11 1709  . ondansetron (ZOFRAN) injection 4 mg  4 mg Intravenous Q6H PRN Noel Christmas      . pantoprazole (PROTONIX) EC tablet 40 mg  40 mg Oral Q1200 Charles  Stewart   40 mg at 08/25/11 1810  . senna-docusate (Senokot-S) tablet 1 tablet  1 tablet Oral BID Noel Christmas      . DISCONTD: 0.9 %  sodium chloride infusion   Intravenous Continuous Noel Christmas      . DISCONTD: pantoprazole (PROTONIX) injection 40 mg  40 mg Intravenous QHS Noel Christmas       No current outpatient prescriptions on file as of 08/26/2011.   Physical Exam: CV: regular rate and rhythm Pulm: CTA bilaterally Neck: supple, no cartoid bruise  Neurologic Examination: Mental Status: Alert, oriented, thought content appropriate.  Speech fluent without evidence of aphasia. Able to follow commands without difficulty. Cranial Nerves: II-Visual fields were normal. III/IV/VI-Pupils were equal and reacted. Extraocular movements were full and conjugate.    V/VII-no facial numbness and no facial weakness. VIII-normal. X-normal speech. XII-midline tongue extension Motor: mild fixation of left arm on rapid orbiting Sensory: Slightly reduced tactile sensation involving left upper extremity, left face and left leg Deep Tendon Reflexes: 2+ and symmetric. Plantars: Flexor bilaterally Cerebellar: Normal finger-to-nose and heel-to-shin testing. Gait: steady   Results for orders placed during the hospital encounter of 08/25/11 (from the past 48 hour(s))  GLUCOSE, CAPILLARY     Status: Normal   Collection Time   08/25/11 11:03 AM      Component Value Range Comment   Glucose-Capillary 78  70 - 99 (mg/dL)  Comment 1 Notify RN     POCT I-STAT, CHEM 8     Status: Normal   Collection Time   08/25/11 11:04 AM      Component Value Range Comment   Sodium 141  135 - 145 (mEq/L)    Potassium 3.9  3.5 - 5.1 (mEq/L)    Chloride 102  96 - 112 (mEq/L)    BUN 9  6 - 23 (mg/dL)    Creatinine, Ser 5.78  0.50 - 1.10 (mg/dL)    Glucose, Bld 97  70 - 99 (mg/dL)    Calcium, Ion 4.69  1.12 - 1.32 (mmol/L)    TCO2 29  0 - 100 (mmol/L)    Hemoglobin 15.0  12.0 - 15.0 (g/dL)    HCT 62.9  52.8  - 41.3 (%)   PROTIME-INR     Status: Normal   Collection Time   08/25/11 11:34 AM      Component Value Range Comment   Prothrombin Time 14.8  11.6 - 15.2 (seconds)    INR 1.14  0.00 - 1.49    APTT     Status: Normal   Collection Time   08/25/11 11:34 AM      Component Value Range Comment   aPTT 31  24 - 37 (seconds)   CBC     Status: Normal   Collection Time   08/25/11 11:34 AM      Component Value Range Comment   WBC 5.7  4.0 - 10.5 (K/uL)    RBC 4.96  3.87 - 5.11 (MIL/uL)    Hemoglobin 13.9  12.0 - 15.0 (g/dL)    HCT 24.4  01.0 - 27.2 (%)    MCV 83.5  78.0 - 100.0 (fL)    MCH 28.0  26.0 - 34.0 (pg)    MCHC 33.6  30.0 - 36.0 (g/dL)    RDW 53.6  64.4 - 03.4 (%)    Platelets 196  150 - 400 (K/uL)   DIFFERENTIAL     Status: Normal   Collection Time   08/25/11 11:34 AM      Component Value Range Comment   Neutrophils Relative 56  43 - 77 (%)    Neutro Abs 3.2  1.7 - 7.7 (K/uL)    Lymphocytes Relative 35  12 - 46 (%)    Lymphs Abs 2.0  0.7 - 4.0 (K/uL)    Monocytes Relative 5  3 - 12 (%)    Monocytes Absolute 0.3  0.1 - 1.0 (K/uL)    Eosinophils Relative 4  0 - 5 (%)    Eosinophils Absolute 0.2  0.0 - 0.7 (K/uL)    Basophils Relative 1  0 - 1 (%)    Basophils Absolute 0.0  0.0 - 0.1 (K/uL)   COMPREHENSIVE METABOLIC PANEL     Status: Abnormal   Collection Time   08/25/11 11:34 AM      Component Value Range Comment   Sodium 137  135 - 145 (mEq/L)    Potassium 3.7  3.5 - 5.1 (mEq/L)    Chloride 100  96 - 112 (mEq/L)    CO2 29  19 - 32 (mEq/L)    Glucose, Bld 90  70 - 99 (mg/dL)    BUN 9  6 - 23 (mg/dL)    Creatinine, Ser 7.42  0.50 - 1.10 (mg/dL)    Calcium 9.6  8.4 - 10.5 (mg/dL)    Total Protein 8.0  6.0 - 8.3 (g/dL)    Albumin 3.7  3.5 - 5.2 (g/dL)    AST 19  0 - 37 (U/L)    ALT 13  0 - 35 (U/L)    Alkaline Phosphatase 75  39 - 117 (U/L)    Total Bilirubin 0.3  0.3 - 1.2 (mg/dL)    GFR calc non Af Amer 86 (*) >90 (mL/min)    GFR calc Af Amer >90  >90 (mL/min)   CK  TOTAL AND CKMB     Status: Abnormal   Collection Time   08/25/11 11:34 AM      Component Value Range Comment   Total CK 281 (*) 7 - 177 (U/L)    CK, MB 4.5 (*) 0.3 - 4.0 (ng/mL)    Relative Index 1.6  0.0 - 2.5    TROPONIN I     Status: Normal   Collection Time   08/25/11 11:34 AM      Component Value Range Comment   Troponin I <0.30  <0.30 (ng/mL)   URINE RAPID DRUG SCREEN (HOSP PERFORMED)     Status: Normal   Collection Time   08/25/11 11:51 AM      Component Value Range Comment   Opiates NONE DETECTED  NONE DETECTED     Cocaine NONE DETECTED  NONE DETECTED     Benzodiazepines NONE DETECTED  NONE DETECTED     Amphetamines NONE DETECTED  NONE DETECTED     Tetrahydrocannabinol NONE DETECTED  NONE DETECTED     Barbiturates NONE DETECTED  NONE DETECTED    MRSA PCR SCREENING     Status: Normal   Collection Time   08/25/11  1:15 PM      Component Value Range Comment   MRSA by PCR NEGATIVE  NEGATIVE     Ct Head Wo Contrast  08/25/2011   15 x 16 mm right thalamic hemorrhage with slight surrounding edema consistent with acute hypertensive bleed.   Mr Angiogram Head Wo Contrast 08/25/2011 1.5 cm diameter right thalamic hemorrhage (estimated volume 1.5 - 2 ml) without associated subarachnoid or intraventricular blood.  No enlarged or occluded cerebral vasculature.  The findings are most consistent with a hypertensive related bleed.   MRA HEAD:  Negative.   Assessment: 41 y.o. 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  Stroke Risk Factors - hypertension  Plan: 1.Tight bp control, goal SBP 130-140s. 2. Transfer to 3000 tele 3. PT/OT. 4. Complete evaluation, US Carotid, ECHO.

## 2011-08-26 NOTE — Progress Notes (Signed)
Occupational Therapy Evaluation Patient Details Name: Kristy Vasquez MRN: [id number] DOB: May 25, 1970 Today's Date: 08/26/2011 Time: 7829-5621 OT Time Calculation (min): 29 min  OT Assessment / Plan / Recommendation Clinical Impression  Pt s/p R thalamic hemorrhage and demonstrating with L sided weakness.  Will benefit from acute OT services to address below problem list in prep for d/c home with assist from daughters. Recommending OPOT.    OT Assessment  Patient needs continued OT Services    Follow Up Recommendations  Supervision - Intermittent;Outpatient OT    Equipment Recommendations       Frequency Min 2X/week    Precautions / Restrictions Restrictions Weight Bearing Restrictions: No   Pertinent Vitals/Pain NA    ADL  Eating/Feeding: Performed;Modified independent Where Assessed - Eating/Feeding: Chair Grooming: Performed;Wash/dry hands;Other (comment) (min guard) Where Assessed - Grooming: Standing at sink Upper Body Bathing: Simulated;Set up Where Assessed - Upper Body Bathing: Sitting, chair Lower Body Bathing: Simulated;Other (comment) (min guard) Where Assessed - Lower Body Bathing: Sit to stand from chair Upper Body Dressing: Performed;Set up Where Assessed - Upper Body Dressing: Sitting, chair Lower Body Dressing: Simulated;Other (comment) (min guard assist) Where Assessed - Lower Body Dressing: Sit to stand from chair Toilet Transfer: Performed;Other (comment) (min guard) Toilet Transfer Method: Proofreader: Regular height toilet Toileting - Clothing Manipulation: Performed;Supervision/safety Where Assessed - Toileting Clothing Manipulation: Sit to stand from 3-in-1 or toilet Toileting - Hygiene: Performed;Modified independent Where Assessed - Toileting Hygiene: Sit on 3-in-1 or toilet Equipment Used: Gait belt Ambulation Related to ADLs: Pt ambulated to BR and sink with min guard assist for safety due to LLE deficits ADL Comments:  Pt requiring min guard for standing balance componenents of ADLs.    OT Goals Acute Rehab OT Goals OT Goal Formulation: With patient Time For Goal Achievement: 09/02/11 Potential to Achieve Goals: Good ADL Goals Pt Will Perform Grooming: Standing at sink;with modified independence ADL Goal: Grooming - Progress: Goal set today Pt Will Transfer to Toilet: with modified independence;Regular height toilet;Ambulation ADL Goal: Toilet Transfer - Progress: Goal set today Pt Will Perform Tub/Shower Transfer: Tub transfer;with modified independence;Ambulation ADL Goal: Tub/Shower Transfer - Progress: Goal set today Arm Goals Additional Arm Goal #1: Pt will incorporate use of LUE during 75% of ADL activity. Arm Goal: Additional Goal #1 - Progress: Goal set today  Visit Information  Last OT Received On: 08/26/11 Assistance Needed: +1 PT/OT Co-Evaluation/Treatment: Yes    Subjective Data  Subjective: My arm just feels heavy. Patient Stated Goal: To take a shower.   Prior Functioning  Home Living Lives With: Daughter Available Help at Discharge: Family;Available 24 hours/day Type of Home: House Home Access: Level entry Home Layout: One level Bathroom Shower/Tub: Tub/shower unit;Curtain Firefighter: Standard Bathroom Accessibility: Yes How Accessible: Accessible via walker Home Adaptive Equipment: None Prior Function Level of Independence: Independent Able to Take Stairs?: Yes Driving: Yes Vocation: Full time employment Communication Communication: No difficulties Dominant Hand: Right    Cognition  Overall Cognitive Status: Appears within functional limits for tasks assessed/performed Arousal/Alertness: Awake/alert Orientation Level: Oriented X4 / Intact Behavior During Session: [name] for tasks performed Cognition - Other Comments: While pt's cognition was [facility] for tasks assessed, but pt slow to process information.  Possible deficits with higher level congnitive tasks.      Extremity/Trunk Assessment Right Upper Extremity Assessment RUE ROM/Strength/Tone: Within functional levels RUE Sensation: WFL - Light Touch;WFL - Proprioception RUE Coordination: WFL - gross/fine motor Left Upper Extremity Assessment LUE ROM/Strength/Tone: Deficits  LUE ROM/Strength/Tone Deficits: Overall strength 3+/5 LUE Sensation: Deficits LUE Sensation Deficits: Impaired sensation in LUE. LUE Coordination: Deficits LUE Coordination Deficits: Decreased gross/fine motor coordinat Right Lower Extremity Assessment RLE ROM/Strength/Tone: Within functional levels RLE Sensation: WFL - Light Touch RLE Coordination: WFL - gross/fine motor Left Lower Extremity Assessment LLE ROM/Strength/Tone: Deficits LLE ROM/Strength/Tone Deficits: Overall 4/5 LLE Sensation: Deficits LLE Sensation Deficits: decreased sensation. decreased impairment LLE Coordination: WFL - gross/fine motor   Mobility Bed Mobility Bed Mobility: Sit to Supine Sit to Supine: 6: Modified independent (Device/Increase time);HOB elevated Transfers Sit to Stand: 4: Min guard;From chair/3-in-1;With armrests;With upper extremity assist;From toilet Stand to Sit: 4: Min guard;To bed;To toilet;To chair/3-in-1;With armrests;With upper extremity assist Details for Transfer Assistance: [place] for safety secondary to slight unsteadiness upon standing   Exercise    Balance    End of Session OT - End of Session Equipment Utilized During Treatment: Gait belt Activity Tolerance: Patient tolerated treatment well Patient left: in bed;with call bell/phone within reach Nurse Communication: Mobility status   08/26/2011 Cipriano Mile OTR/L Pager [phone] Office [phone]  Cipriano Mile 08/26/2011, 1:14 PM

## 2011-08-27 NOTE — Progress Notes (Signed)
  Echocardiogram 2D Echocardiogram has been performed.  Kristy Vasquez 08/27/2011, 10:41 AM

## 2011-08-27 NOTE — Progress Notes (Signed)
CARE MANAGEMENT NOTE 08/27/2011  Patient:  Kristy Vasquez, Kristy Vasquez   Account Number:  [id number]  Date Initiated:  08/27/2011  Documentation initiated by:  Vance Peper  Subjective/Objective Assessment:   41yr old female s/p right thalamic hemorrhage     Action/Plan:   Spoke with patient regarding need for outpatient OT. She has selected [address] location.   Anticipated DC Date:  08/27/2011   Anticipated DC Plan:  HOME/SELF CARE      DC Planning Services  CM consult  OP Neuro Rehab      [facility] Choice  NA   Choice offered to / List presented to:  C-1 Patient   DME arranged  NA      DME agency  NA     HH arranged  NA      HH agency  NA   Status of service:  Completed, signed off  Comments:  08/27/11 1400 Vance Peper, RN BSN Nurse Case Manager Faxed information to [facility] on [address]-[phone]

## 2011-08-27 NOTE — Progress Notes (Signed)
Bilateral carotid artery duplex completed.  Preliminary report is no evidence of significant ICA stenosis. 

## 2011-08-27 NOTE — Progress Notes (Signed)
Patient Kristy Vasquez, 41 year old [place] American female is native of [place], [place], currently residing in [place], [place].  Patient finds meaning in life through her relationship with her two children, ages 76 and 45.  Patient expressed appreciation for Chaplain's provision of pastoral presence and conversation.  I will follow-up as needed.

## 2011-08-27 NOTE — Discharge Instructions (Signed)
STROKE/TIA DISCHARGE INSTRUCTIONS SMOKING Cigarette smoking nearly doubles your risk of having a stroke & is the single most alterable risk factor  If you smoke or have smoked in the last 12 months, you are advised to quit smoking for your health.  Most of the excess cardiovascular risk related to smoking disappears within a year of stopping.  Ask you doctor about anti-smoking medications  [place] Quit Line: 1-800-QUIT NOW  Free Smoking Cessation Classes ([phone]  CHOLESTEROL Know your levels; limit fat & cholesterol in your diet  Lipid Panel  No results found for this basename: chol,  trig,  hdl,  cholhdl,  vldl,  ldlcalc      Many patients benefit from treatment even if their cholesterol is at goal.  Goal: Total Cholesterol (CHOL) less than 160  Goal:  Triglycerides (TRIG) less than 150  Goal:  HDL greater than 40  Goal:  LDL (LDLCALC) less than 100   BLOOD PRESSURE American Stroke Association blood pressure target is less that 120/80 mm/Hg  Your discharge blood pressure is:  BP: 125/85 mmHg  Monitor your blood pressure  Limit your salt and alcohol intake  Many individuals will require more than one medication for high blood pressure  DIABETES (A1c is a blood sugar average for last 3 months) Goal HGBA1c is under 7% (HBGA1c is blood sugar average for last 3 months)  Diabetes: No known diagnosis of diabetes    No results found for this basename: HGBA1C     Your HGBA1c can be lowered with medications, healthy diet, and exercise.  Check your blood sugar as directed by your physician  Call your physician if you experience unexplained or low blood sugars.  PHYSICAL ACTIVITY/REHABILITATION Goal is 30 minutes at least 4 days per week    Activity:   Increase activity slowly, Therapies:   Physical Therapy: Outpatient, Occupational Therapy: Outpatient and Speech Therapy: Outpatient Return to work:  in 1 week if able from OT standpoint  Activity decreases your risk of heart  attack and stroke and makes your heart stronger.  It helps control your weight and blood pressure; helps you relax and can improve your mood.  Participate in a regular exercise program.  Talk with your doctor about the best form of exercise for you (dancing, walking, swimming, cycling).  DIET/WEIGHT Goal is to maintain a healthy weight  Your discharge diet is: Cardiac thin liquids Your height is:  Height: 5\' 2"  (157.5 cm) Your current weight is: Weight: 106 kg (233 lb 11 oz) Your Body Mass Index (BMI) is:  BMI (Calculated): 43.2   Following the type of diet specifically designed for you will help prevent another stroke.  Your goal weight range is:  ***  Your goal Body Mass Index (BMI) is 19-24.  Healthy food habits can help reduce 3 risk factors for stroke:  High cholesterol, hypertension, and excess weight.  RESOURCES Stroke/Support Group:  Call [phone]  they meet the 3rd Sunday of the month on the Rehab Unit at [facility], [place] ( no meetings June, July & Aug).  STROKE EDUCATION PROVIDED/REVIEWED AND GIVEN TO PATIENT Stroke warning signs and symptoms How to activate emergency medical system (call 911). Medications prescribed at discharge. Need for follow-up after discharge. Personal risk factors for stroke. Pneumonia vaccine given:   {STROKE DC YES/NO/DATE:22363} Flu vaccine given:   {STROKE DC YES/NO/DATE:22363} My questions have been answered, the writing is legible, and I understand these instructions.  I will adhere to these goals & educational materials that  have been provided to me after my discharge from the hospital.   Patient will be contacted by [facility]: [phone]

## 2011-08-27 NOTE — Progress Notes (Signed)
Stroke Team Progress Note  HISTORY Kristy Vasquez is an 41 y.o. female history of hypertension presenting with sudden onset of slight speech difficulty, feeling of unsteadiness and feeling of heaviness involving the left extremities. Onset was at 8:59 AM 08/25/2011. She has no previous history of stroke or TIA. She has not been on antiplatelet therapy. CT scan of her head showed an acute small right thalamic hemorrhage without significant mass effect. Blood pressure was 181/103. NIH stroke score was 2. MRI confirmed right thalamic bleeding 1.5cm (estimated volume 1.5-2 ml), without associated subarachnoid or intraventricular blood. Patient was not a TPA candidate secondary to hemorrhage. She was admitted for further evaluation and treatment.  SUBJECTIVE No family is at the bedside.  Overall she feels her condition is stable. She wants to go home. She is not in favor of follow up therapies.  OBJECTIVE Most recent Vital Signs: Filed Vitals:   08/26/11 2300 08/27/11 0241 08/27/11 0603 08/27/11 1000  BP: 158/91 142/89 155/91 112/73  Pulse: 69 74 73 71  Temp: 98.4 F (36.9 C) 98 F (36.7 C) 97.9 F (36.6 C) 98.9 F (37.2 C)  TempSrc: Oral Oral Oral Oral  Resp: 18 18 20 20   Height:      Weight:      SpO2: 97% 99% 98% 93%   CBG (last 3)  Basename 08/25/11 1103  GLUCAP 78   Intake/Output from previous day: 04/21 0701 - 04/22 0700 In: 240 [P.O.:240] Out: 75 [Urine:75]  IV Fluid Intake:     MEDICATIONS    . lisinopril  10 mg Oral Daily  . nebivolol  10 mg Oral Daily  . pantoprazole  40 mg Oral Q1200  . senna-docusate  1 tablet Oral BID   PRN:  acetaminophen, labetalol, ondansetron (ZOFRAN) IV  Diet:  Cardiac thin liquids Activity:   Bathroom privileges DVT Prophylaxis:  SCDs   CLINICALLY SIGNIFICANT STUDIES CBC    Component Value Date/Time   WBC 5.7 08/25/2011 1134   RBC 4.96 08/25/2011 1134   HGB 13.9 08/25/2011 1134   HCT 41.4 08/25/2011 1134   PLT 196 08/25/2011 1134   MCV  83.5 08/25/2011 1134   MCH 28.0 08/25/2011 1134   MCHC 33.6 08/25/2011 1134   RDW 14.1 08/25/2011 1134   LYMPHSABS 2.0 08/25/2011 1134   MONOABS 0.3 08/25/2011 1134   EOSABS 0.2 08/25/2011 1134   BASOSABS 0.0 08/25/2011 1134   CMP    Component Value Date/Time   NA 137 08/25/2011 1134   K 3.7 08/25/2011 1134   CL 100 08/25/2011 1134   CO2 29 08/25/2011 1134   GLUCOSE 90 08/25/2011 1134   BUN 9 08/25/2011 1134   CREATININE 0.83 08/25/2011 1134   CALCIUM 9.6 08/25/2011 1134   PROT 8.0 08/25/2011 1134   ALBUMIN 3.7 08/25/2011 1134   AST 19 08/25/2011 1134   ALT 13 08/25/2011 1134   ALKPHOS 75 08/25/2011 1134   BILITOT 0.3 08/25/2011 1134   GFRNONAA 86* 08/25/2011 1134   GFRAA >90 08/25/2011 1134   COAGS Lab Results  Component Value Date   INR 1.14 08/25/2011   Lipid Panel No results found for this basename: chol, trig, hdl, cholhdl, vldl, ldlcalc   HgbA1C  No results found for this basename: HGBA1C   Cardiac Panel (last 3 results)  Basename 08/25/11 1134  CKTOTAL 281*  CKMB 4.5*  TROPONINI <0.30  RELINDX 1.6   Urinalysis No results found for this basename: colorurine, appearanceur, labspec, phurine, glucoseu, hgbur, bilirubinur, ketonesur, proteinur, urobilinogen, nitrite, leukocytesur  Urine Drug Screen    Component Value Date/Time   LABOPIA NONE DETECTED 08/25/2011 1151   COCAINSCRNUR NONE DETECTED 08/25/2011 1151   LABBENZ NONE DETECTED 08/25/2011 1151   AMPHETMU NONE DETECTED 08/25/2011 1151   THCU NONE DETECTED 08/25/2011 1151   LABBARB NONE DETECTED 08/25/2011 1151    Alcohol Level No results found for this basename: eth   CT of the brain  15 x 16 mm right thalamic hemorrhage with slight surrounding edema consistent with acute hypertensive bleed.  MRI of the brain  1.5 cm diameter right thalamic hemorrhage (estimated volume 1.5 - 2 ml) without associated subarachnoid or intraventricular blood.  No enlarged or occluded cerebral vasculature.  The findings are most consistent with a  hypertensive related bleed.  Mild to moderate surrounding halo of edema and restricted diffusion.  MRA of the brain  Negative.  2D Echocardiogram    Carotid Doppler  No internal carotid artery stenosis bilaterally. Vertebrals with antegrade flow bilaterally.   CXR    EKG   normal sinus rhythm. No significant changes since last EKG  Physical Exam   Young mildly obese African American lady currently not in distress.Awake alert. Afebrile. Head is nontraumatic. Neck is supple without bruit. Hearing is normal. Cardiac exam no murmur or gallop. Lungs are clear to auscultation. Distal pulses are well felt.  Neurological exam :Awake alert oriented x 3 normal speech and language. Mild left lower face asymmetry. Tongue midline. No drift. Mild diminished fine finger movements on left. Orbits right over left upper extremity. Mild left grip weak.. Mildly diminished left hemibody sensation . Normal coordination.   ASSESSMENT Ms. Kristy Vasquez is a 41 y.o. female with a right thalamic hemorrhage secondary to hypertension.  Patient with resultant mild left hemiparesis.  -morbid obesity, Body mass index is 42.74 kg/(m^2). -hypertension  Hospital day # 2  TREATMENT/PLAN -Continue home BP medication regimen -OP PT, OT & ST if patient will agree. OT is the most imperative for job function  Annie Main, AVNP, ANP-BC, GNP-BC [facility] Pager: [phone] 08/27/2011 1:32 PM  Dr. Delia Heady, Stroke Center Medical Director, has personally reviewed chart, pertinent data, examined the patient and developed the plan of care.

## 2011-08-27 NOTE — Progress Notes (Signed)
Physical Therapy Treatment Patient Details Name: Kristy Vasquez MRN: [id number] DOB: 1970/11/24 Today's Date: 08/27/2011 Time: 6644-0347 PT Time Calculation (min): 18 min  PT Assessment / Plan / Recommendation Comments on Treatment Session  41 y.o. female admitted to [facility] for small right thalamic hemorrhage without significant mass effect.  She is progressing very well with her gait and balance.  She had no deficits on her Berg Balance Test and scored 56/56 possible.  We should test her DGII because it will capture more of the dynamic balance than the Sharlene Motts will.      Follow Up Recommendations  Outpatient PT    Equipment Recommendations  None recommended by PT    Frequency Min 4X/week   Plan Discharge plan remains appropriate;Frequency remains appropriate    Precautions / Restrictions   none  Pertinent Vitals/Pain No reports of pain, no vitals taken.      Mobility  Transfers Sit to Stand: 6: Modified independent (Device/Increase time) Stand to Sit: 6: Modified independent (Device/Increase time) Details for Transfer Assistance: uses hands to get OOB  Ambulation/Gait Ambulation/Gait Assistance: 6: Modified independent (Device/Increase time) Ambulation Distance (Feet): 450 Feet Assistive device: None Ambulation/Gait Assistance Details: slow, but steady gait speed, no LOB, but also no challenges Gait Pattern: Step-through pattern  Stairs: Yes Stairs Assistance: 6: Modified independent (Device/Increase time) Stair Management Technique: One rail Right;Forwards;Alternating pattern Number of Stairs: 10      PT Goals Acute Rehab PT Goals PT Goal Formulation: With patient Time For Goal Achievement: 09/02/11 Potential to Achieve Goals: Good Pt will go Sit to Stand: Independently PT Goal: Sit to Stand - Progress: Updated due to goal met Pt will go Stand to Sit: Independently PT Goal: Stand to Sit - Progress: Updated due to goals met Pt will Ambulate: >150 feet;Independently PT  Goal: Ambulate - Progress: Updated due to goal met  Visit Information  Last PT Received On: 08/27/11 Assistance Needed: +1    Subjective Data  Subjective: The patient reports that the bed is making her stiff, but she feels stronger today than yesterday.   Patient Stated Goal: To get back to work ASAP, she works weekends here in the lab.     Cognition  Overall Cognitive Status: Appears within functional limits for tasks assessed/performed Arousal/Alertness: Awake/alert Behavior During Session: [facility] for tasks performed    Balance  Berg Balance Test Sit to Stand: Able to stand without using hands and stabilize independently Standing Unsupported: Able to stand safely 2 minutes Sitting with Back Unsupported but Feet Supported on Floor or Stool: Able to sit safely and securely 2 minutes Stand to Sit: Sits safely with minimal use of hands Transfers: Able to transfer safely, minor use of hands Standing Unsupported with Eyes Closed: Able to stand 10 seconds safely Standing Ubsupported with Feet Together: Able to place feet together independently and stand 1 minute safely From Standing, Reach Forward with Outstretched Arm: Can reach confidently >25 cm (10") From Standing Position, Pick up Object from Floor: Able to pick up shoe safely and easily From Standing Position, Turn to Look Behind Over each Shoulder: Looks behind from both sides and weight shifts well Turn 360 Degrees: Able to turn 360 degrees safely in 4 seconds or less Standing Unsupported, Alternately Place Feet on Step/Stool: Able to stand independently and safely and complete 8 steps in 20 seconds Standing Unsupported, One Foot in Front: Able to place foot tandem independently and hold 30 seconds Standing on One Leg: Able to lift leg independently and hold >  10 seconds Total Score: 56/56   End of Session PT - End of Session Activity Tolerance: Patient tolerated treatment well Patient left: in bed;with call bell/phone within reach      Ryan B. Sherrina Zaugg, PT, DPT [phone] 08/27/2011, 2:44 PM

## 2011-08-30 DIAGNOSIS — I619 Nontraumatic intracerebral hemorrhage, unspecified: Secondary | ICD-10-CM | POA: Diagnosis present

## 2011-08-30 DIAGNOSIS — I10 Essential (primary) hypertension: Secondary | ICD-10-CM | POA: Diagnosis present

## 2011-08-30 NOTE — Discharge Summary (Signed)
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  Past Medical History  Diagnosis Date  . Hypertension    Past Surgical History  Procedure Date  . Tubal ligation    Medication List  As of 08/30/2011  2:44 PM   TAKE these medications         cetirizine 10 MG tablet   Commonly known as: ZYRTEC   Take 10 mg by mouth daily.      nebivolol 10 MG tablet   Commonly known as: BYSTOLIC   Take 10 mg by mouth daily.      olmesartan-hydrochlorothiazide 40-25 MG per tablet   Commonly known as: BENICAR HCT   Take 1 tablet by mouth daily.           LABORATORY STUDIES CBC    Component Value Date/Time   WBC 5.7 08/25/2011 1134   RBC 4.96 08/25/2011 1134   HGB 13.9 08/25/2011 1134   HCT 41.4 08/25/2011 1134   PLT 196 08/25/2011 1134   MCV 83.5 08/25/2011 1134   MCH 28.0 08/25/2011 1134   MCHC 33.6 08/25/2011 1134   RDW 14.1 08/25/2011 1134   LYMPHSABS 2.0 08/25/2011 1134   MONOABS 0.3 08/25/2011 1134   EOSABS 0.2 08/25/2011 1134   BASOSABS 0.0 08/25/2011 1134   CMP    Component Value Date/Time   NA 137 08/25/2011 1134   K 3.7 08/25/2011 1134   CL 100 08/25/2011 1134   CO2 29 08/25/2011 1134   GLUCOSE 90 08/25/2011 1134   BUN 9 08/25/2011 1134   CREATININE 0.83 08/25/2011 1134   CALCIUM 9.6 08/25/2011 1134   PROT 8.0 08/25/2011 1134   ALBUMIN 3.7 08/25/2011 1134   AST 19 08/25/2011 1134   ALT 13 08/25/2011 1134   ALKPHOS 75 08/25/2011 1134   BILITOT 0.3 08/25/2011 1134   GFRNONAA  86* 08/25/2011 1134   GFRAA >90 08/25/2011 1134   COAGS Lab Results  Component Value Date   INR 1.14 08/25/2011   Lipid Panel No results found for this basename: chol,  trig,  hdl,  cholhdl,  vldl,  ldlcalc   HgbA1C  No results found for this basename: HGBA1C   Cardiac Panel (last 3 results) No results found for this basename: CKTOTAL:3,CKMB:3,TROPONINI:3,RELINDX:3 in the last 72 hours Urinalysis No results found for this basename: colorurine,  appearanceur,  labspec,  phurine,  glucoseu,  hgbur,  bilirubinur,  ketonesur,  proteinur,  urobilinogen,  nitrite,  leukocytesur   Urine Drug Screen     Component Value Date/Time   LABOPIA NONE DETECTED 08/25/2011 1151   COCAINSCRNUR NONE DETECTED 08/25/2011 1151   LABBENZ NONE DETECTED 08/25/2011 1151   AMPHETMU NONE DETECTED 08/25/2011 1151   THCU NONE DETECTED 08/25/2011 1151   LABBARB NONE DETECTED 08/25/2011 1151    Alcohol Level No results found for this basename: eth    SIGNIFICANT DIAGNOSTIC STUDIES CT of the brain 15 x 16 mm right thalamic hemorrhage with slight surrounding edema consistent with acute hypertensive bleed.  MRI of the brain 1.5 cm diameter right thalamic hemorrhage (estimated volume 1.5 - 2 ml) without associated subarachnoid or intraventricular blood. No enlarged or occluded cerebral vasculature. The findings are most consistent with a hypertensive related bleed. Mild to moderate surrounding halo of edema and restricted diffusion.  MRA of the brain Negative.  2D Echocardiogram not performed Carotid Doppler No internal carotid artery stenosis bilaterally. Vertebrals with antegrade flow bilaterally.  CXR not performed EKG normal sinus rhythm. No significant changes since last EKG    History of Present Illness   Kristy Vasquez is an 41 y.o. female history of hypertension presenting with sudden onset of slight speech difficulty, feeling of unsteadiness and feeling of heaviness involving the left extremities. Onset was at  8:59 AM 08/25/2011. She has no previous history of stroke or TIA. She has not been on antiplatelet therapy. CT scan of her head showed an acute small right thalamic hemorrhage without significant mass effect. Blood pressure was 181/103. NIH stroke score was 2. MRI confirmed right thalamic bleeding 1.5cm (estimated volume 1.5-2 ml), without associated subarachnoid or intraventricular blood. Patient was not a TPA candidate secondary to hemorrhage. She was admitted to the neuro intensive care unit for further evaluation and treatment.  Hospital Course The patient's hemorrhage remained stable during the first 24h of admission, the time frame with highest risk of rebleeding. Hemorrhage was felt to be secondary to accelerated hypertension, presenting with BP 170/142. Home medications were resumed. BP improving, but may need additional medications in the future. With continued stability, She was transferred to the floor.  Patient with continued stroke symptoms of mild left hemiparesis. Physical therapy, occupational therapy and speech therapy evaluated patient. They recommend outpatient PT and OT and speech therapy. Patient was hesitant to commit to outpatient therapy. 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  Discharge Exam  Blood pressure 125/85, pulse 69, temperature 97.7 F (36.5 C), temperature source Oral, resp. rate 20, height 5\' 2"  (1.575 m), weight 106 kg (233 lb 11 oz), last menstrual period 08/05/2011, SpO2 93.00%. Physical Exam  Young mildly obese African American lady currently not in distress.Awake alert. Afebrile. Head is nontraumatic. Neck is supple without bruit. Hearing is normal. Cardiac exam no murmur or gallop. Lungs are clear to auscultation. Distal pulses are well felt.  Neurological exam :Awake alert oriented x 3 normal speech and language. Mild left lower face asymmetry. Tongue midline. No drift.  Mild diminished fine finger movements on left. Orbits right over left upper extremity. Mild left grip weak.. Mildly diminished left hemibody sensation . Normal coordination.   Discharge Diet     thin liquids  Discharge Plan   - Disposition:  Home with family - Outpatient occupational therapy, physical therapy and speech therapy - avoid aspirin, aspirin-containing products and NSAIDs - Ongoing risk factor control by Primary Care Physician. - Risk factor recommendations:  Hypertension target range 130-140/70-80; obesity  - Follow-up DEFAULT,PROVIDER, MD, MD in 1 week. - Follow-up with Dr. Delia Heady in 1 month.  Signed Annie Main, AVNP, ANP-BC, [facility] Nurse Practitioner 08/30/2011, 2:44 PM  Dr. Delia Heady, Stroke Center Medical Director, has personally reviewed chart, pertinent data, examined the patient and developed the plan of care.

## 2012-03-05 LAB — HM MAMMOGRAPHY

## 2013-12-02 ENCOUNTER — Institutional Professional Consult (permissible substitution): Payer: Self-pay | Admitting: Medical

## 2013-12-03 ENCOUNTER — Encounter: Payer: Self-pay | Admitting: Medical

## 2013-12-03 ENCOUNTER — Ambulatory Visit (INDEPENDENT_AMBULATORY_CARE_PROVIDER_SITE_OTHER): Payer: 59 | Admitting: Medical

## 2013-12-03 VITALS — BP 118/70 | HR 78 | Temp 98.0°F | Resp 16 | Ht 63.0 in | Wt 242.0 lb

## 2013-12-03 DIAGNOSIS — E669 Obesity, unspecified: Secondary | ICD-10-CM

## 2013-12-03 DIAGNOSIS — I10 Essential (primary) hypertension: Secondary | ICD-10-CM

## 2013-12-03 DIAGNOSIS — J069 Acute upper respiratory infection, unspecified: Secondary | ICD-10-CM

## 2013-12-03 DIAGNOSIS — Z8673 Personal history of transient ischemic attack (TIA), and cerebral infarction without residual deficits: Secondary | ICD-10-CM

## 2013-12-03 NOTE — Progress Notes (Signed)
   Subjective:   Kristy Vasquez is a 43 y.o. female presenting on 12/03/2013 with new patient, needs to get established  Here as a new patient today.  Was seeing Dr. Parke SimmersBland, but she is no longer in her insurance network, changing doctors.   She wanted to establish care today.  Has hx/o HTN, CVA, occasional sinus infection.  Has concerns about trying to lose weight.   HTN, and hemorrhagic stroke 2013, is compliant with BP medications daily.  No prior cholesterol medication or daily aspirin.   Has had labs in the last year with Dr. Parke SimmersBland.    Exercise - nothing currently.  Has a treadmill.   Diet - avoids starches, tries to eat healthy.  No prior weight loss program, no prior nutritionist visit.  No prior weight loss medications.   Thinks she may have sinus infection.  Having post nasal drainage, ear fullness, nausea, occasional cough, sinus pressure for several days.  No fever, no vomiting.  No medications for this currently.   No other aggravating or relieving factors.  No other complaint.  Review of Systems ROS as in subjective     Objective:    BP 118/70  Pulse 78  Temp(Src) 98 F (36.7 C) (Oral)  Resp 16  Ht 5\' 3"  (1.6 m)  Wt 242 lb (109.77 kg)  BMI 42.88 kg/m2  LMP 11/06/2013  General appearance: alert, no distress, WD/WN HEENT: normocephalic, sclerae anicteric, TMs pearly, nares patent, no discharge or erythema, pharynx normal Oral cavity: MMM, no lesions Neck: supple, no lymphadenopathy, no thyromegaly, no masses Heart: RRR, normal S1, S2, no murmurs Lungs: CTA bilaterally, no wheezes, rhonchi, or rales Pulses: 2+ symmetric, upper and lower extremities, normal cap refill Ext: no edema     Assessment: Encounter Diagnoses  Name Primary?  . Essential hypertension, benign Yes  . History of stroke   . Obesity, unspecified   . Acute upper respiratory infections of unspecified site      Plan: Hypertension-continue current medications. We'll request records and labs  from Dr. Parke SimmersBland  History of stroke-advise that she likely needs to be on a statin, we will request records. Blood pressure does look good today.  He reportedly had a lipoprofile test within the past year  Obesity-spoke at length about this, advise she start make and exercise changes, counseled at length about diet, advise risk and benefits of weight loss medication. We may consider phentermine going for but not until I reviewed her records and labs. Discussed the potential risk of this medication  URI -discussed supportive care, no indication for antibiotic today  Kristy Vasquez was seen today for new patient, needs to get established.  Diagnoses and associated orders for this visit:  Essential hypertension, benign  History of stroke  Obesity, unspecified  Acute upper respiratory infections of unspecified site    Return pending records.

## 2014-02-18 ENCOUNTER — Ambulatory Visit (INDEPENDENT_AMBULATORY_CARE_PROVIDER_SITE_OTHER): Payer: 59 | Admitting: Medical

## 2014-02-18 ENCOUNTER — Telehealth: Payer: Self-pay | Admitting: Internal Medicine

## 2014-02-18 ENCOUNTER — Encounter: Payer: Self-pay | Admitting: Medical

## 2014-02-18 VITALS — BP 132/92 | HR 84 | Temp 98.2°F | Resp 15 | Wt 239.0 lb

## 2014-02-18 DIAGNOSIS — F430 Acute stress reaction: Secondary | ICD-10-CM

## 2014-02-18 DIAGNOSIS — I10 Essential (primary) hypertension: Secondary | ICD-10-CM

## 2014-02-18 DIAGNOSIS — Z8673 Personal history of transient ischemic attack (TIA), and cerebral infarction without residual deficits: Secondary | ICD-10-CM

## 2014-02-18 DIAGNOSIS — E669 Obesity, unspecified: Secondary | ICD-10-CM

## 2014-02-18 LAB — CBC
HCT: 39.3 % (ref 36.0–46.0)
Hemoglobin: 13.2 g/dL (ref 12.0–15.0)
MCH: 27.7 pg (ref 26.0–34.0)
MCHC: 33.6 g/dL (ref 30.0–36.0)
MCV: 82.4 fL (ref 78.0–100.0)
PLATELETS: 238 10*3/uL (ref 150–400)
RBC: 4.77 MIL/uL (ref 3.87–5.11)
RDW: 14.2 % (ref 11.5–15.5)
WBC: 5.6 10*3/uL (ref 4.0–10.5)

## 2014-02-18 LAB — COMPREHENSIVE METABOLIC PANEL
ALK PHOS: 76 U/L (ref 39–117)
ALT: 13 U/L (ref 0–35)
AST: 16 U/L (ref 0–37)
Albumin: 3.9 g/dL (ref 3.5–5.2)
BILIRUBIN TOTAL: 0.4 mg/dL (ref 0.2–1.2)
BUN: 11 mg/dL (ref 6–23)
CO2: 29 mEq/L (ref 19–32)
Calcium: 9.6 mg/dL (ref 8.4–10.5)
Chloride: 98 mEq/L (ref 96–112)
Creat: 0.85 mg/dL (ref 0.50–1.10)
Glucose, Bld: 74 mg/dL (ref 70–99)
Potassium: 3.6 mEq/L (ref 3.5–5.3)
SODIUM: 136 meq/L (ref 135–145)
TOTAL PROTEIN: 7.7 g/dL (ref 6.0–8.3)

## 2014-02-18 LAB — HEMOGLOBIN A1C
Hgb A1c MFr Bld: 5.9 % — ABNORMAL HIGH (ref <5.7)
Mean Plasma Glucose: 123 mg/dL — ABNORMAL HIGH (ref <117)

## 2014-02-18 LAB — TSH: TSH: 0.765 u[IU]/mL (ref 0.350–4.500)

## 2014-02-18 MED ORDER — SPIRONOLACTONE 25 MG PO TABS: 25.0000 mg | ORAL_TABLET | Freq: Every day | ORAL | Status: DC

## 2014-02-18 MED ORDER — VALSARTAN-HYDROCHLOROTHIAZIDE 320-25 MG PO TABS: 1.0000 | ORAL_TABLET | Freq: Every day | ORAL | Status: DC

## 2014-02-18 MED ORDER — ALPRAZOLAM 1 MG PO TABS: ORAL_TABLET | ORAL | Status: DC

## 2014-02-18 MED ORDER — ATORVASTATIN CALCIUM 20 MG PO TABS: 20.0000 mg | ORAL_TABLET | Freq: Every day | ORAL | Status: DC

## 2014-02-18 MED ORDER — AMLODIPINE BESYLATE 10 MG PO TABS: 10.0000 mg | ORAL_TABLET | Freq: Every day | ORAL | Status: DC

## 2014-02-18 MED ORDER — ASPIRIN EC 81 MG PO TBEC: 81.0000 mg | DELAYED_RELEASE_TABLET | Freq: Every day | ORAL | Status: DC

## 2014-02-18 NOTE — Patient Instructions (Signed)
  Thank you for giving me the opportunity to serve you today.    Your diagnosis today includes: Encounter Diagnoses  Name Primary?  . History of stroke Yes  . Essential hypertension   . Obesity   . Acute stress reaction      Specific recommendations today include: Diet  Increase your water intake, get at least 64 ounces of water daily  Eat 3-4 fruits daily  Eat plenty of vegetables throughout the day, preferably each meal  Eat good sources of grains such as oatmeal, barley, whole grain pasta, whole grain bread, but limit the serving size to 1 cup of oatmeal or pasta per meal or 2 slices of bread per meal  We don't need to meat at each meal, however if you do eat meat, limit serving size to the size of your palm, and eat chicken fish or [place]urkey, lean cuts of meat  Eat beans every day as this is a good nutrient source and helps to curb appetite  Consider using a program such as Weight Watchers  Consider using a Smart phone app such as My Fitness [employer] or [employer] to track your calories and progress   Things to limit or avoid:  Avoid fast food, fried foods, fatty foods  Limit sweets, ice cream, cake and other baked goods  Avoid soda, beer, alcohol, sweet tea  Exercise  You need to be exercising most days of the week for 30-45 minutes or more  Good forms of exercise include walking, hiking, stationary bike or bicycling outside, lap swimming, aerobics class, dance, Zumba  Consider getting a trainer at a gym to help with exercise   Consider weighing yourself daily to keep track of your weight

## 2014-02-18 NOTE — Progress Notes (Signed)
   Subjective:   Kristy Vasquez is a 43 y.o. female presenting on 02/18/2014 with Follow-up  Here for f/u on weight.  Has hx/o HTN, CVA, has concerns about trying to lose weight.   HTN, and hemorrhagic stroke 2013, is compliant with BP medications daily.  No prior cholesterol medication or daily aspirin.  She is not compliant with low salt diet.   Has had labs in the last year with Dr. Parke SimmersBland.   Exercise - nothing currently.  Has a treadmill.   Diet - avoids starches, tries to eat healthy.  No prior weight loss program, no prior nutritionist visit.  No prior weight loss medications.   She is going on a cruise at the end of this month.  She is afraid because her friends are sending her pictures and new info about problems with cruises in the past, and she is worried about her nerves getting the best of her.  She can't swim, she has fear about the ship sinking and other problems.  Wants something to take for nerves for the trip.  No other complaint.  Review of Systems     Objective:    BP 132/92  Pulse 84  Temp(Src) 98.2 F (36.8 C) (Oral)  Resp 15  Wt 239 lb (108.41 kg)  LMP 01/26/2014  General appearance: alert, no distress, WD/WN Neck: supple, no lymphadenopathy, no thyromegaly, no masses Heart: RRR, normal S1, S2, no murmurs Lungs: CTA bilaterally, no wheezes, rhonchi, or rales Pulses: 2+ symmetric, upper and lower extremities, normal cap refill Ext: no edema     Assessment: Encounter Diagnoses  Name Primary?  . Essential hypertension Yes  . History of stroke   . Obesity   . Acute stress reaction      Plan: Hypertension-continue current medications. Goal 120/80, she will monitor BP, but major effort today will be aimed at lifestyle changes. Reviewed echocardiogram from 2013.    Hx/o stroke - begin Lipitor and Aspirin 81mg  QHS, discussed risks and benefits of medication, f/u labs in 35mo  Obesity - she is not using good diet discretion and not exercising so much.   Advised that a pill is not going to solve the problem.  We spent a lot of time educating on diet recommendations, exercise including walking 45+ minutes daily, water intake, diet choices, and reasonable weight loss goals. Labs today.   Consider adding metformin if glucose elevated.  F/u pending labs, 73mo.  Acute stress- reassured her about her fears of upcoming cruise.   Can use Xanax prn for panic attack, anxiety short term.   discussed proper use of medication, risks/benefits of medication.    Kristy Vasquez was seen today for follow-up.  Diagnoses and associated orders for this visit:  Essential hypertension - CBC - Comprehensive metabolic panel - TSH - Hemoglobin A1c  History of stroke - CBC - Comprehensive metabolic panel - TSH - Hemoglobin A1c  Obesity - CBC - Comprehensive metabolic panel - TSH - Hemoglobin A1c  Acute stress reaction  Other Orders - ALPRAZolam (XANAX) 1 MG tablet; 1/2-1 tablet prn for nerves, up to TID    Return pending labs.

## 2014-02-18 NOTE — Telephone Encounter (Signed)
I called Dr. Tedra SenegalBland's office to get medical records that we sent a request back in July for and when i called pt was seen back on April 29,2015 and they have not been signed off on from the Doctor so they can not send those office notes yet. But will send over the lab results.

## 2014-02-19 ENCOUNTER — Other Ambulatory Visit: Payer: Self-pay

## 2014-02-19 ENCOUNTER — Encounter: Payer: Self-pay | Admitting: Medical

## 2014-02-19 ENCOUNTER — Encounter: Payer: Self-pay | Admitting: Internal Medicine

## 2014-04-26 ENCOUNTER — Other Ambulatory Visit: Payer: Self-pay | Admitting: Family Medicine

## 2014-04-26 ENCOUNTER — Telehealth: Payer: Self-pay | Admitting: Medical

## 2014-04-26 MED ORDER — ATORVASTATIN CALCIUM 20 MG PO TABS: 20.0000 mg | ORAL_TABLET | Freq: Every day | ORAL | Status: DC

## 2014-04-26 NOTE — Telephone Encounter (Signed)
90 day supply sent to [facility]

## 2014-06-15 ENCOUNTER — Ambulatory Visit (INDEPENDENT_AMBULATORY_CARE_PROVIDER_SITE_OTHER): Payer: 59 | Admitting: Medical

## 2014-06-15 ENCOUNTER — Encounter: Payer: Self-pay | Admitting: Medical

## 2014-06-15 VITALS — BP 122/80 | HR 104 | Temp 99.0°F | Wt 236.0 lb

## 2014-06-15 DIAGNOSIS — M79601 Pain in right arm: Secondary | ICD-10-CM

## 2014-06-15 DIAGNOSIS — G5603 Carpal tunnel syndrome, bilateral upper limbs: Secondary | ICD-10-CM

## 2014-06-15 DIAGNOSIS — G5602 Carpal tunnel syndrome, left upper limb: Secondary | ICD-10-CM

## 2014-06-15 DIAGNOSIS — M79602 Pain in left arm: Principal | ICD-10-CM

## 2014-06-15 DIAGNOSIS — G5601 Carpal tunnel syndrome, right upper limb: Secondary | ICD-10-CM

## 2014-06-15 DIAGNOSIS — E669 Obesity, unspecified: Secondary | ICD-10-CM

## 2014-06-15 DIAGNOSIS — I10 Essential (primary) hypertension: Secondary | ICD-10-CM

## 2014-06-15 DIAGNOSIS — M79603 Pain in arm, unspecified: Secondary | ICD-10-CM

## 2014-06-15 DIAGNOSIS — R202 Paresthesia of skin: Secondary | ICD-10-CM

## 2014-06-15 DIAGNOSIS — G5620 Lesion of ulnar nerve, unspecified upper limb: Secondary | ICD-10-CM

## 2014-06-15 MED ORDER — GABAPENTIN 100 MG PO CAPS: 100.0000 mg | ORAL_CAPSULE | Freq: Two times a day (BID) | ORAL | Status: DC

## 2014-06-15 NOTE — Progress Notes (Signed)
Subjective: Here for bilateral hand numbness for the past month or so. She notes numbness in both hands, all fingers, day or night. The numbness will awake her in the middle of night.  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  Other significant health history includes hypertension and obesity. Very limited with exercise.  She denies similar prior issue. Drink plenty of water daily. Denies neck pain, denies frank pain in the arms, mostly just numbness sensations bilat hand numbness for month.  Awakes in the middle of the night with hand numbness. No history of anemia,  No other aggravating or relieving factors. No other complaint.  Past Medical History  Diagnosis Date  . Obesity   . Stroke 2013    hemorrhagic stroke  . Hypertension 2013   Review of systems as in subjective  Objective: BP 122/80 mmHg  Pulse 104  Temp(Src) 99 F (37.2 C) (Oral)  Wt 236 lb (107.049 kg)  Gen: wdwn, nad Skin: unremarkable Neck: Supple, nontender, no thyromegaly, no lymphadenopathy, normal range of motion MSK: Arms hands fingers nontender no obvious deformity, normal range of motion no swelling Neuro: Positive Tinel's and Phalen's, slightly reduced grip strength bilateral hands but otherwise normal strength and sensation of arms and hands and fingers Ext: no edema, no cyanosis or clubbing Pulses normal   Assessment: Encounter Diagnoses  Name Primary?  . Paresthesia and pain of both upper extremities Yes  . Bilateral carpal tunnel syndrome   . Ulnar nerve compression, unspecified laterality   . Essential hypertension   . Obesity     Plan: Her current symptoms and exam findings suggest carpal and ulnar nerve compression.  Discussed diagnosis, treatment options, severity of symptoms. At this point begin bilateral reinforced wrist splints at bedtime, use the ibuprofen that helps for the next 1-2 weeks, begin Neurontin 100 mg once daily initially.   Recheck in one month. If not improving at that point may need to go ahead and see orthopedics. Continue efforts to lose weight, continue blood pressure medicine a healthy exercise regularly avoid added salt

## 2014-07-12 ENCOUNTER — Other Ambulatory Visit: Payer: Self-pay | Admitting: Medical

## 2014-08-03 ENCOUNTER — Telehealth: Payer: Self-pay | Admitting: Internal Medicine

## 2014-08-03 NOTE — Telephone Encounter (Signed)
Refill request for a 90 days supply for gabapentin 100mg  to [facility]

## 2014-08-03 NOTE — Telephone Encounter (Signed)
Patient is aware of the message and she made her follow up appointment.

## 2014-08-03 NOTE — Telephone Encounter (Signed)
Needs OV as we were going to potentially increase dose or see what other things we needed to do upon recheck.

## 2014-08-04 ENCOUNTER — Telehealth: Payer: Self-pay | Admitting: Medical

## 2014-08-04 NOTE — Telephone Encounter (Signed)
Recv'd fax ref[facility] request [facility] mg [facility]

## 2014-08-04 NOTE — Telephone Encounter (Signed)
Please see yesterday's phone message.  She needs an office visit recheck

## 2014-08-05 ENCOUNTER — Ambulatory Visit: Payer: Self-pay | Admitting: Medical

## 2014-08-05 NOTE — Telephone Encounter (Signed)
Left message for patient to call back and reschedule missed appointment.

## 2014-08-06 NOTE — Telephone Encounter (Signed)
Left another message for pt to call & schedule appt

## 2014-08-16 ENCOUNTER — Encounter: Payer: Self-pay | Admitting: Medical

## 2014-10-23 ENCOUNTER — Other Ambulatory Visit: Payer: Self-pay | Admitting: Medical

## 2015-05-24 DIAGNOSIS — Z0000 Encounter for general adult medical examination without abnormal findings: Secondary | ICD-10-CM | POA: Diagnosis not present

## 2015-05-24 DIAGNOSIS — Z1389 Encounter for screening for other disorder: Secondary | ICD-10-CM | POA: Diagnosis not present

## 2015-05-24 DIAGNOSIS — I10 Essential (primary) hypertension: Secondary | ICD-10-CM | POA: Diagnosis not present

## 2015-05-24 DIAGNOSIS — J018 Other acute sinusitis: Secondary | ICD-10-CM | POA: Diagnosis not present

## 2015-05-24 DIAGNOSIS — E559 Vitamin D deficiency, unspecified: Secondary | ICD-10-CM | POA: Diagnosis not present

## 2015-05-24 DIAGNOSIS — J020 Streptococcal pharyngitis: Secondary | ICD-10-CM | POA: Diagnosis not present

## 2015-05-24 DIAGNOSIS — R5383 Other fatigue: Secondary | ICD-10-CM | POA: Diagnosis not present

## 2015-05-24 MED FILL — CIPROFLOXACIN HCL 250 MG TA: 250 | 3 days supply | Qty: 6 | Fill #0

## 2015-05-24 MED FILL — FLUCONAZOLE 200 MG TABLET: 200 | 1 days supply | Qty: 1 | Fill #0

## 2015-05-24 MED FILL — AMOX-CLAV 500-125 MG TABLET: 500-125 | 10 days supply | Qty: 20 | Fill #0

## 2015-05-31 MED FILL — FLUCONAZOLE 200 MG TABLET: 200 | 1 days supply | Qty: 1 | Fill #0

## 2015-06-10 DIAGNOSIS — N898 Other specified noninflammatory disorders of vagina: Secondary | ICD-10-CM | POA: Diagnosis not present

## 2015-06-10 MED FILL — metroNIDAZOLE 0.75 % GEL: 0.75 | 5 days supply | Qty: 70 | Fill #0

## 2015-06-10 MED FILL — TINIDAZOLE 500 MG TABLET: 500 | 2 days supply | Qty: 8 | Fill #0

## 2015-06-21 DIAGNOSIS — J111 Influenza due to unidentified influenza virus with other respiratory manifestations: Secondary | ICD-10-CM | POA: Diagnosis not present

## 2015-06-21 MED FILL — HYDROCODONE-CHLORPHENIRAM S: 10-8 | 12 days supply | Qty: 120 | Fill #0

## 2015-06-21 MED FILL — OSELTAMIVIR PHOS 75 MG CAP: 75 | 5 days supply | Qty: 10 | Fill #0

## 2015-06-24 DIAGNOSIS — J111 Influenza due to unidentified influenza virus with other respiratory manifestations: Secondary | ICD-10-CM | POA: Diagnosis not present

## 2015-06-24 DIAGNOSIS — R0602 Shortness of breath: Secondary | ICD-10-CM | POA: Diagnosis not present

## 2015-06-28 DIAGNOSIS — Z1231 Encounter for screening mammogram for malignant neoplasm of breast: Secondary | ICD-10-CM | POA: Diagnosis not present

## 2015-06-28 DIAGNOSIS — Z114 Encounter for screening for human immunodeficiency virus [HIV]: Secondary | ICD-10-CM | POA: Diagnosis not present

## 2015-06-28 DIAGNOSIS — Z01419 Encounter for gynecological examination (general) (routine) without abnormal findings: Secondary | ICD-10-CM | POA: Diagnosis not present

## 2015-06-28 DIAGNOSIS — Z113 Encounter for screening for infections with a predominantly sexual mode of transmission: Secondary | ICD-10-CM | POA: Diagnosis not present

## 2015-06-28 DIAGNOSIS — R87615 Unsatisfactory cytologic smear of cervix: Secondary | ICD-10-CM | POA: Diagnosis not present

## 2015-06-28 DIAGNOSIS — Z1159 Encounter for screening for other viral diseases: Secondary | ICD-10-CM | POA: Diagnosis not present

## 2015-06-28 DIAGNOSIS — Z1151 Encounter for screening for human papillomavirus (HPV): Secondary | ICD-10-CM | POA: Diagnosis not present

## 2015-06-30 ENCOUNTER — Telehealth (HOSPITAL_BASED_OUTPATIENT_CLINIC_OR_DEPARTMENT_OTHER): Payer: Self-pay | Admitting: Emergency Medicine

## 2015-07-28 DIAGNOSIS — R87615 Unsatisfactory cytologic smear of cervix: Secondary | ICD-10-CM | POA: Diagnosis not present

## 2015-07-28 DIAGNOSIS — Z113 Encounter for screening for infections with a predominantly sexual mode of transmission: Secondary | ICD-10-CM | POA: Diagnosis not present

## 2015-07-28 DIAGNOSIS — N920 Excessive and frequent menstruation with regular cycle: Secondary | ICD-10-CM | POA: Diagnosis not present

## 2015-08-15 DIAGNOSIS — N920 Excessive and frequent menstruation with regular cycle: Secondary | ICD-10-CM | POA: Diagnosis not present

## 2015-08-29 DIAGNOSIS — E559 Vitamin D deficiency, unspecified: Secondary | ICD-10-CM | POA: Diagnosis not present

## 2015-08-29 DIAGNOSIS — I10 Essential (primary) hypertension: Secondary | ICD-10-CM | POA: Diagnosis not present

## 2015-08-29 DIAGNOSIS — E7800 Pure hypercholesterolemia, unspecified: Secondary | ICD-10-CM | POA: Diagnosis not present

## 2015-08-29 MED FILL — AMLOD-VALSA-HCTZ 10-320-25: 10-320-25 | 90 days supply | Qty: 90 | Fill #0

## 2015-09-02 MED FILL — PROMETHAZINE 25 MG TABLET: 25 | 2 days supply | Qty: 12 | Fill #0

## 2015-09-02 MED FILL — IBUPROFEN 800 MG TABLET: 800 | 8 days supply | Qty: 30 | Fill #0

## 2015-09-05 MED FILL — OXYCODONE/APAP 5-325: 5-325 | 1 days supply | Qty: 10 | Fill #0

## 2015-09-13 DIAGNOSIS — N920 Excessive and frequent menstruation with regular cycle: Secondary | ICD-10-CM | POA: Diagnosis not present

## 2015-09-28 DIAGNOSIS — N920 Excessive and frequent menstruation with regular cycle: Secondary | ICD-10-CM | POA: Diagnosis not present

## 2015-10-24 MED FILL — FLUCONAZOLE 150 MG TABLET: 150 | 1 days supply | Qty: 1 | Fill #0

## 2015-12-12 DIAGNOSIS — A5901 Trichomonal vulvovaginitis: Secondary | ICD-10-CM | POA: Diagnosis not present

## 2015-12-12 DIAGNOSIS — N760 Acute vaginitis: Secondary | ICD-10-CM | POA: Diagnosis not present

## 2015-12-12 MED FILL — metroNIDAZOLE 500 MG TABS: 500 | 6 days supply | Qty: 14 | Fill #0

## 2016-01-17 DIAGNOSIS — R202 Paresthesia of skin: Secondary | ICD-10-CM | POA: Diagnosis not present

## 2016-01-17 DIAGNOSIS — L0292 Furuncle, unspecified: Secondary | ICD-10-CM | POA: Diagnosis not present

## 2016-01-17 MED FILL — AMLOD-VALSA-HCTZ 10-320-25: 10-320-25 | 90 days supply | Qty: 90 | Fill #1

## 2016-01-17 MED FILL — TRIAMCINOLONE 0.1% LOTION: 0.1 | 30 days supply | Qty: 60 | Fill #0

## 2016-01-17 MED FILL — DOXYCYCLINE MONO 100 MG TAB: 100 | 7 days supply | Qty: 14 | Fill #0

## 2016-04-05 DIAGNOSIS — J029 Acute pharyngitis, unspecified: Secondary | ICD-10-CM | POA: Diagnosis not present

## 2016-04-05 DIAGNOSIS — I10 Essential (primary) hypertension: Secondary | ICD-10-CM | POA: Diagnosis not present

## 2016-04-05 DIAGNOSIS — J40 Bronchitis, not specified as acute or chronic: Secondary | ICD-10-CM | POA: Diagnosis not present

## 2016-04-05 MED FILL — DOXYCYCLINE MONO 100 MG TAB: 100 | 7 days supply | Qty: 14 | Fill #0

## 2016-04-05 MED FILL — BENZONATATE 100 MG CAPSULE: 100 | 10 days supply | Qty: 40 | Fill #0

## 2016-04-19 DIAGNOSIS — L309 Dermatitis, unspecified: Secondary | ICD-10-CM | POA: Diagnosis not present

## 2016-04-19 DIAGNOSIS — J40 Bronchitis, not specified as acute or chronic: Secondary | ICD-10-CM | POA: Diagnosis not present

## 2016-04-19 DIAGNOSIS — I10 Essential (primary) hypertension: Secondary | ICD-10-CM | POA: Diagnosis not present

## 2016-04-19 MED FILL — TRIAMCINOLONE 0.1% CREAM: 0.1 | 20 days supply | Qty: 80 | Fill #0

## 2016-09-18 DIAGNOSIS — J069 Acute upper respiratory infection, unspecified: Secondary | ICD-10-CM | POA: Diagnosis not present

## 2016-09-19 MED FILL — AMOX-CLAV 875-125 MG TABLET: 875-125 | 10 days supply | Qty: 20 | Fill #0

## 2016-09-25 DIAGNOSIS — E559 Vitamin D deficiency, unspecified: Secondary | ICD-10-CM | POA: Diagnosis not present

## 2016-09-25 DIAGNOSIS — Z114 Encounter for screening for human immunodeficiency virus [HIV]: Secondary | ICD-10-CM | POA: Diagnosis not present

## 2016-09-25 DIAGNOSIS — Z0000 Encounter for general adult medical examination without abnormal findings: Secondary | ICD-10-CM | POA: Diagnosis not present

## 2016-09-25 DIAGNOSIS — I10 Essential (primary) hypertension: Secondary | ICD-10-CM | POA: Diagnosis not present

## 2016-09-25 DIAGNOSIS — R0602 Shortness of breath: Secondary | ICD-10-CM | POA: Diagnosis not present

## 2016-10-05 MED FILL — BENZONATATE 100 MG CAP: 100 | 10 days supply | Qty: 40 | Fill #0

## 2016-10-25 DIAGNOSIS — E559 Vitamin D deficiency, unspecified: Secondary | ICD-10-CM | POA: Diagnosis not present

## 2016-10-25 DIAGNOSIS — B373 Candidiasis of vulva and vagina: Secondary | ICD-10-CM | POA: Diagnosis not present

## 2016-10-25 MED FILL — FLUCONAZOLE 150 MG TABLET: 150 | 14 days supply | Qty: 2 | Fill #0

## 2017-02-21 ENCOUNTER — Emergency Department (HOSPITAL_COMMUNITY)
Admission: EM | Admit: 2017-02-21 | Discharge: 2017-02-21 | Disposition: A | Discharge status: Home / Self Care | Payer: 59 | Attending: Emergency Medicine | Admitting: Emergency Medicine

## 2017-02-21 ENCOUNTER — Encounter (HOSPITAL_COMMUNITY): Payer: Self-pay | Admitting: Emergency Medicine

## 2017-02-21 DIAGNOSIS — R040 Epistaxis: Secondary | ICD-10-CM | POA: Diagnosis not present

## 2017-02-21 NOTE — ED Notes (Signed)
Pt states since she has not had any bleeding since she has been here she is going to leave and follow up with her doctor in the morning

## 2017-02-21 NOTE — ED Triage Notes (Signed)
Pt states she had a nose bleed yesterday and then another one tonight  Pt states it bled for 2 hours  No active bleeding at this time  Pt's blood pressure is elevated  Pt has hx of HTN and is currently on medication States she takes it as directed but not at the same time every day

## 2017-02-24 DIAGNOSIS — I10 Essential (primary) hypertension: Secondary | ICD-10-CM | POA: Diagnosis not present

## 2017-02-24 DIAGNOSIS — E7800 Pure hypercholesterolemia, unspecified: Secondary | ICD-10-CM | POA: Diagnosis not present

## 2017-02-24 DIAGNOSIS — J3089 Other allergic rhinitis: Secondary | ICD-10-CM | POA: Diagnosis not present

## 2017-02-24 DIAGNOSIS — M2669 Other specified disorders of temporomandibular joint: Secondary | ICD-10-CM | POA: Diagnosis not present

## 2017-02-27 ENCOUNTER — Encounter (HOSPITAL_COMMUNITY): Payer: Self-pay | Admitting: Emergency Medicine

## 2017-02-27 ENCOUNTER — Emergency Department (HOSPITAL_COMMUNITY)
Admission: EM | Admit: 2017-02-27 | Discharge: 2017-02-27 | Disposition: A | Discharge status: Home / Self Care | Payer: 59 | Attending: Emergency Medicine | Admitting: Emergency Medicine

## 2017-02-27 DIAGNOSIS — R040 Epistaxis: Secondary | ICD-10-CM | POA: Diagnosis present

## 2017-02-27 DIAGNOSIS — Z5321 Procedure and treatment not carried out due to patient leaving prior to being seen by health care provider: Secondary | ICD-10-CM | POA: Insufficient documentation

## 2017-02-27 MED ORDER — OXYMETAZOLINE HCL 0.05 % NA SOLN
1.0000 | Freq: Once | NASAL | Status: AC
  Administered 2017-02-27: 1 via NASAL
  Filled 2017-02-27: qty 15

## 2017-02-27 NOTE — ED Triage Notes (Signed)
Pt reports intermittent nose bleeds x 1 week. Has seen PCP for same, told it is sinus/weather related. Denies pain, no blood thinner use. Hx HTN.

## 2017-02-27 NOTE — ED Notes (Signed)
Pt stated that she couldn't wait any longer and would return tomorrow morning. RN Notified.Marland Kitchen..Marland Kitchen

## 2017-03-02 ENCOUNTER — Encounter (HOSPITAL_COMMUNITY): Payer: Self-pay | Admitting: Emergency Medicine

## 2017-03-02 ENCOUNTER — Emergency Department (HOSPITAL_COMMUNITY)
Admission: EM | Admit: 2017-03-02 | Discharge: 2017-03-02 | Disposition: A | Discharge status: Home / Self Care | Payer: 59 | Attending: Emergency Medicine | Admitting: Emergency Medicine

## 2017-03-02 DIAGNOSIS — I10 Essential (primary) hypertension: Secondary | ICD-10-CM | POA: Insufficient documentation

## 2017-03-02 DIAGNOSIS — R040 Epistaxis: Secondary | ICD-10-CM | POA: Diagnosis not present

## 2017-03-02 DIAGNOSIS — Z8673 Personal history of transient ischemic attack (TIA), and cerebral infarction without residual deficits: Secondary | ICD-10-CM | POA: Insufficient documentation

## 2017-03-02 DIAGNOSIS — Z7982 Long term (current) use of aspirin: Secondary | ICD-10-CM | POA: Insufficient documentation

## 2017-03-02 DIAGNOSIS — Z79899 Other long term (current) drug therapy: Secondary | ICD-10-CM | POA: Insufficient documentation

## 2017-03-02 LAB — CBC
HEMATOCRIT: 39 % (ref 36.0–46.0)
HEMOGLOBIN: 13.1 g/dL (ref 12.0–15.0)
MCH: 29.4 pg (ref 26.0–34.0)
MCHC: 33.6 g/dL (ref 30.0–36.0)
MCV: 87.6 fL (ref 78.0–100.0)
Platelets: 213 10*3/uL (ref 150–400)
RBC: 4.45 MIL/uL (ref 3.87–5.11)
RDW: 13.3 % (ref 11.5–15.5)
WBC: 7.8 10*3/uL (ref 4.0–10.5)

## 2017-03-02 MED ORDER — OXYMETAZOLINE HCL 0.05 % NA SOLN
1.0000 | Freq: Once | NASAL | Status: AC
  Administered 2017-03-02: 1 via NASAL
  Filled 2017-03-02: qty 15

## 2017-03-02 MED ORDER — OXYMETAZOLINE HCL 0.05 % NA SOLN: 2.0000 | Freq: Four times a day (QID) | NASAL | 0 refills | Status: AC | PRN

## 2017-03-02 NOTE — ED Notes (Signed)
The pt has had a nose bleed for 4 days.  No rfecengt cold cough  She has sinus problems she has been bleeding from the nose since approx 1400 today.  No pain  She has continuous blood coming   From her lt nostril which is clogged with blood clots  But it also is bleeding from  Rt nostril

## 2017-03-02 NOTE — ED Provider Notes (Signed)
[facility] EMERGENCY DEPARTMENT Provider Note   CSN: [id number] Arrival date & time: 03/02/17  1356    History   Chief Complaint Chief Complaint  Patient presents with  . Epistaxis    HPI Kristy Vasquez is a 46 y.o. female  with history of HTN and hemorrhagic stroke who presents to the ED with 4-day history of nosebleeds. States these occur at random and last ~2hours.  Not on blood thinners and denies recent facial trauma or drug use. Denies pain but does state she took 2 tablets of Ibuprofen yesterday. Recently seen by PCP who recommended keeping nostrils moisturized. She has been using vaseline with no improvement in symptoms. Denies prior history of nosebleeds and personal/family history of coagulopathies.   HPI  Past Medical History:  Diagnosis Date  . Hypertension 2013  . Obesity   . Stroke [facility]) 2013   hemorrhagic stroke    Patient Active Problem List   Diagnosis Date Noted  . Cerebral hemorrhage (HCC) 08/30/2011  . Accelerated hypertension 08/30/2011  . Obesity, Class III, BMI 40-49.9 (morbid obesity) (HCC) 08/30/2011    Past Surgical History:  Procedure Laterality Date  . NOVASURE ABLATION    . TUBAL LIGATION      OB History    No data available       Home Medications    Prior to Admission medications   Medication Sig Start Date End Date Taking? Authorizing Provider  ALPRAZolam Prudy Feeler) 1 MG tablet 1/2-1 tablet prn for nerves, up to TID 02/18/14   Tysinger, Kermit Balo, PA-C  amLODipine (NORVASC) 10 MG tablet TAKE 1 TABLET (10 MG TOTAL) BY MOUTH DAILY. 10/25/14   Tysinger, Kermit Balo, PA-C  aspirin EC 81 MG tablet Take 1 tablet (81 mg total) by mouth daily. 02/18/14   Tysinger, Kermit Balo, PA-C  atorvastatin (LIPITOR) 20 MG tablet Take 1 tablet (20 mg total) by mouth daily. 04/26/14   Tysinger, Kermit Balo, PA-C  cetirizine (ZYRTEC) 10 MG tablet Take 10 mg by mouth daily.    [provider]  gabapentin (NEURONTIN) 100 MG capsule TAKE 1  CAPSULE (100 MG TOTAL) BY MOUTH 2 (TWO) TIMES DAILY. 07/12/14   Tysinger, Kermit Balo, PA-C  oxymetazoline (AFRIN) 0.05 % nasal spray Place 2 sprays into both nostrils every 6 (six) hours as needed for congestion. 03/02/17 03/02/18  Burna Cash, MD  spironolactone (ALDACTONE) 25 MG tablet Take 1 tablet (25 mg total) by mouth daily. 02/18/14   Tysinger, Kermit Balo, PA-C  valsartan-hydrochlorothiazide (DIOVAN-HCT) 320-25 MG per tablet Take 1 tablet by mouth daily. 02/18/14   Tysinger, Kermit Balo, PA-C    Family History Family History  Problem Relation Age of Onset  . Cancer Other   . Diabetes Other     Social History Social History  Substance Use Topics  . Smoking status: Never Smoker  . Smokeless tobacco: Never Used  . Alcohol use No     Allergies   Patient has no known allergies.   Review of Systems Review of Systems  Constitutional: Negative for activity change, appetite change, chills, diaphoresis, fatigue and fever.  HENT: Positive for nosebleeds. Negative for trouble swallowing.   Respiratory: Negative for cough, chest tightness, shortness of breath and wheezing.   Cardiovascular: Negative for chest pain, palpitations and leg swelling.  Gastrointestinal: Negative for abdominal pain, constipation, diarrhea, nausea and vomiting.  Genitourinary: Negative for difficulty urinating.  Musculoskeletal: Negative for arthralgias and myalgias.  Neurological: Negative for dizziness, light-headedness and headaches.  All other systems  reviewed and are negative.    Physical Exam Updated Vital Signs BP 120/80   Pulse 92   Temp 98.2 F (36.8 C) (Oral)   Resp 18   Ht 5\' 2"  (1.575 m)   Wt 104.3 kg (230 lb)   LMP 02/15/2017 (Approximate)   SpO2 100%   BMI 42.07 kg/m   Physical Exam  Constitutional: She is oriented to person, place, and time. She appears well-developed and well-nourished. No distress.  HENT:  Head: Normocephalic and atraumatic.  There is significant bleeding  from L nostril. Blood clots also noted. Very minimal bleeding in R nostril. Nasal mucosa appeared normal and healthy. No blood appreciated in oropharynx.   Eyes: Pupils are equal, round, and reactive to light. Conjunctivae are normal.  Neck: Normal range of motion. Neck supple.  Cardiovascular: Normal rate, regular rhythm and normal heart sounds.  Exam reveals no gallop and no friction rub.   No murmur heard. Pulmonary/Chest: Effort normal and breath sounds normal. No respiratory distress. She has no wheezes. She has no rales.  Abdominal: Soft. Bowel sounds are normal. There is no tenderness.  Musculoskeletal: Normal range of motion. She exhibits no edema.  Neurological: She is alert and oriented to person, place, and time.  Skin: Skin is warm and dry. Capillary refill takes less than 2 seconds. She is not diaphoretic.     ED Treatments / Results  Labs (all labs ordered are listed, but only abnormal results are displayed) Labs Reviewed  CBC    EKG  EKG Interpretation None       Radiology No results found.  Procedures Procedures (including critical care time)  Medications Ordered in ED Medications  oxymetazoline (AFRIN) 0.05 % nasal spray 1 spray (1 spray Each Nare Given 03/02/17 1601)     Initial Impression / Assessment and Plan / ED Course  I have reviewed the triage vital signs and the nursing notes.  Pertinent labs & imaging results that were available during my care of the patient were reviewed by me and considered in my medical decision making (see chart for details).    Kristy Vasquez is a 46 yo female with history of HTN and hemorrhagic stroke who presents to the ED with 4-day history of nosebleeds. Not on blood thinners and denies recent facial trauma or drug use. Recently seen by PCP who recommended keeping nostrils moisturized. Has been using vaseline with no improvement. VSS and in no acute distress on exam. Difficult examination as there is a significant amount  of bright red blood and clots coming from L nostril. Source of bleeding not located but appeared to be anterior. Hgb stable at 13.1. Received Afrin with resolution of bleeding. Patient asked to apply direct pressure for 10 minutes and will reassess.   Patient reassessed and bleeding resolved. Will d/c home with Afrin q6-8h PRN and instructions to hold direct pressure to nose for 15 minutes if bleeding reoccurs. Advised patient to avoid NSAIDs in the setting of significant bleeding and recommended taking Tylenol if in pain. Recommended ENT follow up. Patient also advised to follow with PCP. Returns precautions discussed. Patient voiced understanding and is in agreement with plan.   Case discussed with Dr. Jodi MourningZavitz.   Final Clinical Impressions(s) / ED Diagnoses   Final diagnoses:  Nosebleed    New Prescriptions Discharge Medication List as of 03/02/2017  5:02 PM    START taking these medications   Details  oxymetazoline (AFRIN) 0.05 % nasal spray Place 2 sprays into both nostrils every  6 (six) hours as needed for congestion., Starting Sat 03/02/2017, Until Sun 03/02/2018, Print         Burna Cash, MD 03/02/17 1610    Blane Ohara, MD 03/03/17 1701

## 2017-03-02 NOTE — ED Triage Notes (Signed)
Pt c/o B/L nosebleed on and off.

## 2017-03-02 NOTE — ED Notes (Signed)
The pts nose has stopped bleeding at present  Observing her now

## 2017-03-02 NOTE — Discharge Instructions (Signed)
[facility]. Please continue using Afrin 1-2 sprays in each nostril every 4-6 hours as needed for bleeding. If you start bleeding at home make sure to apply direct pressure to the nose for 10 minutes. Return to the ED if you try pressure and the medication but the bleeding persists. Please follow up with ear nose and throat doctor.

## 2017-03-07 DIAGNOSIS — R040 Epistaxis: Secondary | ICD-10-CM | POA: Diagnosis not present

## 2017-03-07 DIAGNOSIS — J343 Hypertrophy of nasal turbinates: Secondary | ICD-10-CM | POA: Diagnosis not present

## 2017-06-07 DIAGNOSIS — Z79899 Other long term (current) drug therapy: Secondary | ICD-10-CM | POA: Diagnosis not present

## 2017-06-07 DIAGNOSIS — I10 Essential (primary) hypertension: Secondary | ICD-10-CM | POA: Diagnosis not present

## 2017-06-07 MED FILL — OLMSRTN-AMLDPN-HCTZ 40-10-2: 40-10-25 | 30 days supply | Qty: 30 | Fill #0

## 2017-08-07 ENCOUNTER — Telehealth: Payer: Self-pay

## 2017-08-07 NOTE — Telephone Encounter (Signed)
Pt was called to schedule an appt with Vincenza HewsShane. No answer left a message. KH

## 2017-08-17 DIAGNOSIS — R05 Cough: Secondary | ICD-10-CM | POA: Diagnosis not present

## 2017-08-17 DIAGNOSIS — J018 Other acute sinusitis: Secondary | ICD-10-CM | POA: Diagnosis not present

## 2017-10-25 ENCOUNTER — Ambulatory Visit: Payer: Self-pay | Admitting: Family Medicine

## 2017-10-25 ENCOUNTER — Encounter: Payer: Self-pay | Admitting: Family Medicine

## 2017-10-25 VITALS — BP 145/102 | HR 82 | Temp 98.1°F | Resp 18 | Ht 62.0 in | Wt 244.0 lb

## 2017-10-25 DIAGNOSIS — I10 Essential (primary) hypertension: Secondary | ICD-10-CM

## 2017-10-25 DIAGNOSIS — Z0000 Encounter for general adult medical examination without abnormal findings: Secondary | ICD-10-CM

## 2017-10-25 LAB — POCT URINALYSIS DIPSTICK
BILIRUBIN UA: NEGATIVE
Glucose, UA: NEGATIVE
KETONES UA: NEGATIVE
Leukocytes, UA: NEGATIVE
Nitrite, UA: NEGATIVE
PH UA: 7 (ref 5.0–8.0)
Protein, UA: NEGATIVE
Spec Grav, UA: 1.02 (ref 1.010–1.025)
UROBILINOGEN UA: 1 U/dL

## 2017-10-25 NOTE — Progress Notes (Signed)
Patient ID: Kristy Vasquez, female    DOB: [birth year], 47 y.o.   MRN: [id number]  PCP: Jac Canavanysinger, David S, PA-C  Chief Complaint  Patient presents with  . Annual Exam    Wellness exam     Subjective:  HPI Kristy Vasquez is a 47 y.o. female presents for a routine wellness exam in order to satisfy employer sponsored health benefits.  Medical problems significant for prediabetes, uncontrolled hypertension, hx of thalamic cerebral hemorrhage, and morbid obesity. She has not seen a primary care provider in several years. Occasional obtains refills on antihypertensive medications at [facility]. She reports no recent recheck of hemoglobin A1C and or fasting labs. She reports compliance with home blood pressure monitoring (readings range 120-140/90-100). Endorses visual disturbances when blood pressure is "high".  Endorses poor diet rich in starches, sugar, and fat rich foods. Reports no routine exercise, although increases daily steps by parking at distance to facilitate walking. Denies activity intolerance, shortness of breath, chest pain, dizziness, new weakness, polyuria-dipsia-phagia, or neuropathic pain. Endorses periodic occipital headaches occurring occasional upon awakening. Reports adherence with current blood pressure medication although medication effective is currently not monitored by PCP.  Family History: Maternal grandmother non insulin dependent-diabetes (deceased)  Family history is negative for cardiovascular disease or lung disease.  Overdue Health Maintenance:  PAP Smear  HIV screening TDAP? Social History   Socioeconomic History  . Marital status: Single    Spouse name: Not on file  . Number of children: Not on file  . Years of education: Not on file  . Highest education level: Not on file  Occupational History  . Not on file  Social Needs  . Financial resource strain: Not on file  . Food insecurity:    Worry: Not on file    Inability: Not on file  .  Transportation needs:    Medical: Not on file    Non-medical: Not on file  Tobacco Use  . Smoking status: Never Smoker  . Smokeless tobacco: Never Used  Substance and Sexual Activity  . Alcohol use: No  . Drug use: No  . Sexual activity: Not on file  Lifestyle  . Physical activity:    Days per week: Not on file    Minutes per session: Not on file  . Stress: Not on file  Relationships  . Social connections:    Talks on phone: Not on file    Gets together: Not on file    Attends religious service: Not on file    Active member of club or organization: Not on file    Attends meetings of clubs or organizations: Not on file    Relationship status: Not on file  . Intimate partner violence:    Fear of current or ex partner: Not on file    Emotionally abused: Not on file    Physically abused: Not on file    Forced sexual activity: Not on file  Other Topics Concern  . Not on file  Social History Narrative  . Not on file    Family History  Problem Relation Age of Onset  . Cancer Other   . Diabetes Other      Review of Systems  Constitutional: Negative.   HENT: Negative for dental problem, drooling, ear discharge, ear pain, facial swelling, hearing loss, mouth sores, nosebleeds, rhinorrhea, sinus pressure, sinus pain, sneezing, sore throat, tinnitus, trouble swallowing and voice change.        Chronic sinusitis   Eyes: Negative.  Respiratory: Negative.   Cardiovascular: Negative.   Gastrointestinal: Negative.   Endocrine: Positive for polyuria. Negative for polydipsia and polyphagia.  Genitourinary: Negative.   Skin: Negative.   Neurological: Positive for headaches.  Psychiatric/Behavioral: Negative.  Negative for decreased concentration and dysphoric mood. The patient is not nervous/anxious.     Patient Active Problem List   Diagnosis Date Noted  . Cerebral hemorrhage (HCC) 08/30/2011  . Accelerated hypertension 08/30/2011  . Obesity, Class III, BMI 40-49.9 (morbid  obesity) (HCC) 08/30/2011    No Known Allergies  Prior to Admission medications   Medication Sig Start Date End Date Taking? Authorizing Provider  amLODipine (NORVASC) 10 MG tablet TAKE 1 TABLET (10 MG TOTAL) BY MOUTH DAILY. 10/25/14  Yes Tysinger, Kermit Balo, PA-C  oxymetazoline (AFRIN) 0.05 % nasal spray Place 2 sprays into both nostrils every 6 (six) hours as needed for congestion. 03/02/17 03/02/18 Yes Santos-Sanchez, Chelsea Primus, MD  spironolactone (ALDACTONE) 25 MG tablet Take 1 tablet (25 mg total) by mouth daily. 02/18/14  Yes Tysinger, Kermit Balo, PA-C  valsartan-hydrochlorothiazide (DIOVAN-HCT) 320-25 MG per tablet Take 1 tablet by mouth daily. 02/18/14  Yes Tysinger, Kermit Balo, PA-C  ALPRAZolam Prudy Feeler) 1 MG tablet 1/2-1 tablet prn for nerves, up to TID Patient not taking: Reported on 10/25/2017 02/18/14   Tysinger, Kermit Balo, PA-C  aspirin EC 81 MG tablet Take 1 tablet (81 mg total) by mouth daily. Patient not taking: Reported on 10/25/2017 02/18/14   Tysinger, Kermit Balo, PA-C  atorvastatin (LIPITOR) 20 MG tablet Take 1 tablet (20 mg total) by mouth daily. Patient not taking: Reported on 10/25/2017 04/26/14   Tysinger, Kermit Balo, PA-C  cetirizine (ZYRTEC) 10 MG tablet Take 10 mg by mouth daily.    [provider]  gabapentin (NEURONTIN) 100 MG capsule TAKE 1 CAPSULE (100 MG TOTAL) BY MOUTH 2 (TWO) TIMES DAILY. Patient not taking: Reported on 10/25/2017 07/12/14   Tysinger, Kermit Balo, PA-C    Past Medical, Surgical Family and Social History reviewed and updated.    Objective:   Today's Vitals   10/25/17 1010  BP: (!) 145/102  Pulse: 82  Resp: 18  Temp: 98.1 F (36.7 C)  TempSrc: Oral  SpO2: 98%  Weight: 244 lb (110.7 kg)  Height: 5\' 2"  (1.575 m)    Wt Readings from Last 3 Encounters:  03/02/17 230 lb (104.3 kg)  02/27/17 230 lb (104.3 kg)  06/15/14 236 lb (107 kg)    Physical Exam  Constitutional: She is oriented to person, place, and time. She appears well-developed and  well-nourished.  HENT:  Head: Normocephalic and atraumatic.  Right Ear: External ear normal.  Left Ear: External ear normal.  Nose: Nose normal.  Mouth/Throat: Oropharynx is clear and moist. No oropharyngeal exudate.  Eyes: Pupils are equal, round, and reactive to light. Conjunctivae and EOM are normal.  Fundoscopic exam:      The right eye shows no AV nicking, no exudate, no hemorrhage and no papilledema.       The left eye shows no AV nicking, no exudate, no hemorrhage and no papilledema.  Neck: No JVD present. No thyromegaly present.  Cardiovascular: Normal rate, regular rhythm, normal heart sounds and intact distal pulses.  Pulmonary/Chest: Effort normal and breath sounds normal.  Abdominal: Soft. Bowel sounds are normal. She exhibits no distension and no mass. There is no tenderness. There is no rebound and no guarding. No hernia.  Musculoskeletal: Normal range of motion. She exhibits no edema.  Lymphadenopathy:    She  has cervical adenopathy.  Neurological: She is alert and oriented to person, place, and time. She displays normal reflexes. No cranial nerve deficit or sensory deficit. Coordination normal.  Skin: Skin is warm and dry.  Psychiatric: She has a normal mood and affect. Her behavior is normal. Judgment and thought content normal.   Assessment & Plan:  1. Wellness examination, POCT urinalysis dipstick mild trace of RBC. Negative of protein and or glucose. Age-appropriate anticipatory guidance provided. Patient verbalized understanding of the importance of follow-up with PCP.   2. Morbid obesity (HCC) Goal BMI specific to you <40. Encouraged efforts to reduce weight include engaging in physical activity as tolerated with goal of 150 minutes per week. Improve dietary choices and eat a meal regimen consistent with a Mediterranean or DASH diet. Reduce simple carbohydrates. Do not skip meals and eat healthy snacks throughout the day to avoid over-eating at dinner. Set a goal  weight loss that is achievable for you.  3. Hypertension, uncontrolled. Advised to follow-up immediately with PCP for medication management.  If symptoms worsen or do not improve, return for follow-up, follow-up with PCP, or at the emergency department if severity of symptoms warrant a higher level of care.     Godfrey Pick. Tiburcio Pea, MSN, FNP-C [facility]  [address]  [place], [place] [address] [phone]

## 2017-10-25 NOTE — Patient Instructions (Addendum)
It is very important that you follow-up with your PCP. Your last labs collected in 2015 indicated that you were prediabetic. Your blood pressure was elevated during today's visit and you were previously prescribed medication for hypertension management. Untreated hypertension, can lead to severe cardiovascular disease including heart attack and stroke.    Hypertension Hypertension is another name for high blood pressure. High blood pressure forces your heart to work harder to pump blood. This can cause problems over time. There are two numbers in a blood pressure reading. There is a top number (systolic) over a bottom number (diastolic). It is best to have a blood pressure below 120/80. Healthy choices can help lower your blood pressure. You may need medicine to help lower your blood pressure if:  Your blood pressure cannot be lowered with healthy choices.  Your blood pressure is higher than 130/80.  Follow these instructions at home: Eating and drinking  If directed, follow the DASH eating plan. This diet includes: ? Filling half of your plate at each meal with fruits and vegetables. ? Filling one quarter of your plate at each meal with whole grains. Whole grains include whole wheat pasta, brown rice, and whole grain bread. ? Eating or drinking low-fat dairy products, such as skim milk or low-fat yogurt. ? Filling one quarter of your plate at each meal with low-fat (lean) proteins. Low-fat proteins include fish, skinless chicken, eggs, beans, and tofu. ? Avoiding fatty meat, cured and processed meat, or chicken with skin. ? Avoiding premade or processed food.  Eat less than 1,500 mg of salt (sodium) a day.  Limit alcohol use to no more than 1 drink a day for nonpregnant women and 2 drinks a day for men. One drink equals 12 oz of beer, 5 oz of wine, or 1 oz of hard liquor. Lifestyle  Work with your doctor to stay at a healthy weight or to lose weight. Ask your doctor what the best weight  is for you.  Get at least 30 minutes of exercise that causes your heart to beat faster (aerobic exercise) most days of the week. This may include walking, swimming, or biking.  Get at least 30 minutes of exercise that strengthens your muscles (resistance exercise) at least 3 days a week. This may include lifting weights or pilates.  Do not use any products that contain nicotine or tobacco. This includes cigarettes and e-cigarettes. If you need help quitting, ask your doctor.  Check your blood pressure at home as told by your doctor.  Keep all follow-up visits as told by your doctor. This is important. Medicines  Take over-the-counter and prescription medicines only as told by your doctor. Follow directions carefully.  Do not skip doses of blood pressure medicine. The medicine does not work as well if you skip doses. Skipping doses also puts you at risk for problems.  Ask your doctor about side effects or reactions to medicines that you should watch for. Contact a doctor if:  You think you are having a reaction to the medicine you are taking.  You have headaches that keep coming back (recurring).  You feel dizzy.  You have swelling in your ankles.  You have trouble with your vision. Get help right away if:  You get a very bad headache.  You start to feel confused.  You feel weak or numb.  You feel faint.  You get very bad pain in your: ? Chest. ? Belly (abdomen).  You throw up (vomit) more than once.  You have trouble breathing. Summary  Hypertension is another name for high blood pressure.  Making healthy choices can help lower blood pressure. If your blood pressure cannot be controlled with healthy choices, you may need to take medicine. This information is not intended to replace advice given to you by your health care provider. Make sure you discuss any questions you have with your health care provider. Document Released: 10/10/2007 Document Revised: 03/21/2016  Document Reviewed: 03/21/2016 Elsevier Interactive Patient Education  2018 [employer].    Prediabetes Eating Plan Prediabetes-also called impaired glucose tolerance or impaired fasting glucose-is a condition that causes blood sugar (blood glucose) levels to be higher than normal. Following a healthy diet can help to keep prediabetes under control. It can also help to lower the risk of type 2 diabetes and heart disease, which are increased in people who have prediabetes. Along with regular exercise, a healthy diet:  Promotes weight loss.  Helps to control blood sugar levels.  Helps to improve the way that the body uses insulin.  What do I need to know about this eating plan?  Use the glycemic index (GI) to plan your meals. The index tells you how quickly a food will raise your blood sugar. Choose low-GI foods. These foods take a longer time to raise blood sugar.  Pay close attention to the amount of carbohydrates in the food that you eat. Carbohydrates increase blood sugar levels.  Keep track of how many calories you take in. Eating the right amount of calories will help you to achieve a healthy weight. Losing about 7 percent of your starting weight can help to prevent type 2 diabetes.  You may want to follow a Mediterranean diet. This diet includes a lot of vegetables, lean meats or fish, whole grains, fruits, and healthy oils and fats. What foods can I eat? Grains Whole grains, such as whole-wheat or whole-grain breads, crackers, cereals, and pasta. Unsweetened oatmeal. Bulgur. Barley. Quinoa. Brown rice. Corn or whole-wheat flour tortillas or taco shells. Vegetables Lettuce. Spinach. Peas. Beets. Cauliflower. Cabbage. Broccoli. Carrots. Tomatoes. Squash. Eggplant. Herbs. Peppers. Onions. Cucumbers. Brussels sprouts. Fruits Berries. Bananas. Apples. Oranges. Grapes. Papaya. Mango. Pomegranate. Kiwi. Grapefruit. Cherries. Meats and Other Protein Sources Seafood. Lean meats, such as  chicken and [place] or lean cuts of pork and beef. Tofu. Eggs. Nuts. Beans. Dairy Low-fat or fat-free dairy products, such as yogurt, cottage cheese, and cheese. Beverages Water. Tea. Coffee. Sugar-free or diet soda. Seltzer water. Milk. Milk alternatives, such as soy or almond milk. Condiments Mustard. Relish. Low-fat, low-sugar ketchup. Low-fat, low-sugar barbecue sauce. Low-fat or fat-free mayonnaise. Sweets and Desserts Sugar-free or low-fat pudding. Sugar-free or low-fat ice cream and other frozen treats. Fats and Oils Avocado. Walnuts. Olive oil. The items listed above may not be a complete list of recommended foods or beverages. Contact your dietitian for more options. What foods are not recommended? Grains Refined white flour and flour products, such as bread, pasta, snack foods, and cereals. Beverages Sweetened drinks, such as sweet iced tea and soda. Sweets and Desserts Baked goods, such as cake, cupcakes, pastries, cookies, and cheesecake. The items listed above may not be a complete list of foods and beverages to avoid. Contact your dietitian for more information. This information is not intended to replace advice given to you by your health care provider. Make sure you discuss any questions you have with your health care provider. Document Released: 09/07/2014 Document Revised: 09/29/2015 Document Reviewed: 05/19/2014 Elsevier Interactive Patient Education  2017 [employer]    Calorie Counting for Weight Loss Calories are units of energy. Your body needs a certain amount of calories from food to keep you going throughout the day. When you eat more calories than your body needs, your body stores the extra calories as fat. When you eat fewer calories than your body needs, your body burns fat to get the energy it needs. Calorie counting means keeping track of how many calories you eat and drink each day. Calorie counting can be helpful if you need to lose weight. If you make  sure to eat fewer calories than your body needs, you should lose weight. Ask your health care provider what a healthy weight is for you. For calorie counting to work, you will need to eat the right number of calories in a day in order to lose a healthy amount of weight per week. A dietitian can help you determine how many calories you need in a day and will give you suggestions on how to reach your calorie goal.  A healthy amount of weight to lose per week is usually 1-2 lb (0.5-0.9 kg). This usually means that your daily calorie intake should be reduced by 500-750 calories.  Eating 1,200 - 1,500 calories per day can help most women lose weight.  Eating 1,500 - 1,800 calories per day can help most men lose weight.  What is my plan? My goal is to have __________ calories per day. If I have this many calories per day, I should lose around __________ pounds per week. What do I need to know about calorie counting? In order to meet your daily calorie goal, you will need to:  Find out how many calories are in each food you would like to eat. Try to do this before you eat.  Decide how much of the food you plan to eat.  Write down what you ate and how many calories it had. Doing this is called keeping a food log.  To successfully lose weight, it is important to balance calorie counting with a healthy lifestyle that includes regular activity. Aim for 150 minutes of moderate exercise (such as walking) or 75 minutes of vigorous exercise (such as running) each week. Where do I find calorie information?  The number of calories in a food can be found on a Nutrition Facts label. If a food does not have a Nutrition Facts label, try to look up the calories online or ask your dietitian for help. Remember that calories are listed per serving. If you choose to have more than one serving of a food, you will have to multiply the calories per serving by the amount of servings you plan to eat. For example, the  label on a package of bread might say that a serving size is 1 slice and that there are 90 calories in a serving. If you eat 1 slice, you will have eaten 90 calories. If you eat 2 slices, you will have eaten 180 calories. How do I keep a food log? Immediately after each meal, record the following information in your food log:  What you ate. Don't forget to include toppings, sauces, and other extras on the food.  How much you ate. This can be measured in cups, ounces, or number of items.  How many calories each food and drink had.  The total number of calories in the meal.  Keep your food log near you, such as in a small notebook in your pocket, or use a  mobile app or website. Some programs will calculate calories for you and show you how many calories you have left for the day to meet your goal. What are some calorie counting tips?  Use your calories on foods and drinks that will fill you up and not leave you hungry: ? Some examples of foods that fill you up are nuts and nut butters, vegetables, lean proteins, and high-fiber foods like whole grains. High-fiber foods are foods with more than 5 g fiber per serving. ? Drinks such as sodas, specialty coffee drinks, alcohol, and juices have a lot of calories, yet do not fill you up.  Eat nutritious foods and avoid empty calories. Empty calories are calories you get from foods or beverages that do not have many vitamins or protein, such as candy, sweets, and soda. It is better to have a nutritious high-calorie food (such as an avocado) than a food with few nutrients (such as a bag of chips).  Know how many calories are in the foods you eat most often. This will help you calculate calorie counts faster.  Pay attention to calories in drinks. Low-calorie drinks include water and unsweetened drinks.  Pay attention to nutrition labels for "low fat" or "fat free" foods. These foods sometimes have the same amount of calories or more calories than the  full fat versions. They also often have added sugar, starch, or salt, to make up for flavor that was removed with the fat.  Find a way of tracking calories that works for you. Get creative. Try different apps or programs if writing down calories does not work for you. What are some portion control tips?  Know how many calories are in a serving. This will help you know how many servings of a certain food you can have.  Use a measuring cup to measure serving sizes. You could also try weighing out portions on a kitchen scale. With time, you will be able to estimate serving sizes for some foods.  Take some time to put servings of different foods on your favorite plates, bowls, and cups so you know what a serving looks like.  Try not to eat straight from a bag or box. Doing this can lead to overeating. Put the amount you would like to eat in a cup or on a plate to make sure you are eating the right portion.  Use smaller plates, glasses, and bowls to prevent overeating.  Try not to multitask (for example, watch TV or use your computer) while eating. If it is time to eat, sit down at a table and enjoy your food. This will help you to know when you are full. It will also help you to be aware of what you are eating and how much you are eating. What are tips for following this plan? Reading food labels  Check the calorie count compared to the serving size. The serving size may be smaller than what you are used to eating.  Check the source of the calories. Make sure the food you are eating is high in vitamins and protein and low in saturated and trans fats. Shopping  Read nutrition labels while you shop. This will help you make healthy decisions before you decide to purchase your food.  Make a grocery list and stick to it. Cooking  Try to cook your favorite foods in a healthier way. For example, try baking instead of frying.  Use low-fat dairy products. Meal planning  Use more fruits and  vegetables.  Half of your plate should be fruits and vegetables.  Include lean proteins like poultry and fish. How do I count calories when eating out?  Ask for smaller portion sizes.  Consider sharing an entree and sides instead of getting your own entree.  If you get your own entree, eat only half. Ask for a box at the beginning of your meal and put the rest of your entree in it so you are not tempted to eat it.  If calories are listed on the menu, choose the lower calorie options.  Choose dishes that include vegetables, fruits, whole grains, low-fat dairy products, and lean protein.  Choose items that are boiled, broiled, grilled, or steamed. Stay away from items that are buttered, battered, fried, or served with cream sauce. Items labeled "crispy" are usually fried, unless stated otherwise.  Choose water, low-fat milk, unsweetened iced tea, or other drinks without added sugar. If you want an alcoholic beverage, choose a lower calorie option such as a glass of wine or light beer.  Ask for dressings, sauces, and syrups on the side. These are usually high in calories, so you should limit the amount you eat.  If you want a salad, choose a garden salad and ask for grilled meats. Avoid extra toppings like bacon, cheese, or fried items. Ask for the dressing on the side, or ask for olive oil and vinegar or lemon to use as dressing.  Estimate how many servings of a food you are given. For example, a serving of cooked rice is  cup or about the size of half a baseball. Knowing serving sizes will help you be aware of how much food you are eating at restaurants. The list below tells you how big or small some common portion sizes are based on everyday objects: ? 1 oz-4 stacked dice. ? 3 oz-1 deck of cards. ? 1 tsp-1 die. ? 1 Tbsp- a ping-pong ball. ? 2 Tbsp-1 ping-pong ball. ?  cup- baseball. ? 1 cup-1 baseball. Summary  Calorie counting means keeping track of how many calories you eat and  drink each day. If you eat fewer calories than your body needs, you should lose weight.  A healthy amount of weight to lose per week is usually 1-2 lb (0.5-0.9 kg). This usually means reducing your daily calorie intake by 500-750 calories.  The number of calories in a food can be found on a Nutrition Facts label. If a food does not have a Nutrition Facts label, try to look up the calories online or ask your dietitian for help.  Use your calories on foods and drinks that will fill you up, and not on foods and drinks that will leave you hungry.  Use smaller plates, glasses, and bowls to prevent overeating. This information is not intended to replace advice given to you by your health care provider. Make sure you discuss any questions you have with your health care provider. Document Released: 04/23/2005 Document Revised: 03/23/2016 Document Reviewed: 03/23/2016 Elsevier Interactive Patient Education  [employer].

## 2018-07-03 DIAGNOSIS — R040 Epistaxis: Secondary | ICD-10-CM | POA: Diagnosis not present

## 2018-07-03 DIAGNOSIS — I10 Essential (primary) hypertension: Secondary | ICD-10-CM | POA: Diagnosis not present

## 2018-07-03 DIAGNOSIS — Z3202 Encounter for pregnancy test, result negative: Secondary | ICD-10-CM | POA: Diagnosis not present

## 2018-07-03 DIAGNOSIS — R9431 Abnormal electrocardiogram [ECG] [EKG]: Secondary | ICD-10-CM | POA: Diagnosis not present

## 2018-10-02 DIAGNOSIS — Z91138 Patient's unintentional underdosing of medication regimen for other reason: Secondary | ICD-10-CM | POA: Diagnosis not present

## 2018-10-02 DIAGNOSIS — I119 Hypertensive heart disease without heart failure: Secondary | ICD-10-CM | POA: Diagnosis not present

## 2018-10-23 DIAGNOSIS — I119 Hypertensive heart disease without heart failure: Secondary | ICD-10-CM | POA: Diagnosis not present

## 2018-11-22 ENCOUNTER — Other Ambulatory Visit: Payer: Self-pay

## 2018-11-22 ENCOUNTER — Inpatient Hospital Stay (HOSPITAL_BASED_OUTPATIENT_CLINIC_OR_DEPARTMENT_OTHER)
Admission: EM | Admit: 2018-11-22 | Discharge: 2018-11-25 | DRG: 065 | Disposition: A | Discharge status: Home / Self Care | Payer: 59 | Attending: Neurology | Admitting: Neurology

## 2018-11-22 ENCOUNTER — Encounter (HOSPITAL_BASED_OUTPATIENT_CLINIC_OR_DEPARTMENT_OTHER): Payer: Self-pay

## 2018-11-22 ENCOUNTER — Emergency Department (HOSPITAL_BASED_OUTPATIENT_CLINIC_OR_DEPARTMENT_OTHER): Payer: 59

## 2018-11-22 DIAGNOSIS — Z6841 Body Mass Index (BMI) 40.0 and over, adult: Secondary | ICD-10-CM

## 2018-11-22 DIAGNOSIS — Z8673 Personal history of transient ischemic attack (TIA), and cerebral infarction without residual deficits: Secondary | ICD-10-CM

## 2018-11-22 DIAGNOSIS — I160 Hypertensive urgency: Secondary | ICD-10-CM | POA: Diagnosis present

## 2018-11-22 DIAGNOSIS — I629 Nontraumatic intracranial hemorrhage, unspecified: Secondary | ICD-10-CM | POA: Diagnosis not present

## 2018-11-22 DIAGNOSIS — Z713 Dietary counseling and surveillance: Secondary | ICD-10-CM

## 2018-11-22 DIAGNOSIS — R410 Disorientation, unspecified: Secondary | ICD-10-CM | POA: Diagnosis not present

## 2018-11-22 DIAGNOSIS — Z7982 Long term (current) use of aspirin: Secondary | ICD-10-CM | POA: Diagnosis not present

## 2018-11-22 DIAGNOSIS — E785 Hyperlipidemia, unspecified: Secondary | ICD-10-CM | POA: Diagnosis present

## 2018-11-22 DIAGNOSIS — I10 Essential (primary) hypertension: Secondary | ICD-10-CM | POA: Diagnosis not present

## 2018-11-22 DIAGNOSIS — R4182 Altered mental status, unspecified: Secondary | ICD-10-CM | POA: Diagnosis not present

## 2018-11-22 DIAGNOSIS — I610 Nontraumatic intracerebral hemorrhage in hemisphere, subcortical: Secondary | ICD-10-CM | POA: Diagnosis present

## 2018-11-22 DIAGNOSIS — Z20828 Contact with and (suspected) exposure to other viral communicable diseases: Secondary | ICD-10-CM | POA: Diagnosis present

## 2018-11-22 DIAGNOSIS — I619 Nontraumatic intracerebral hemorrhage, unspecified: Secondary | ICD-10-CM | POA: Diagnosis present

## 2018-11-22 DIAGNOSIS — Z79899 Other long term (current) drug therapy: Secondary | ICD-10-CM | POA: Diagnosis not present

## 2018-11-22 DIAGNOSIS — I361 Nonrheumatic tricuspid (valve) insufficiency: Secondary | ICD-10-CM | POA: Diagnosis not present

## 2018-11-22 DIAGNOSIS — E66813 Obesity, class 3: Secondary | ICD-10-CM | POA: Diagnosis present

## 2018-11-22 DIAGNOSIS — G936 Cerebral edema: Secondary | ICD-10-CM | POA: Diagnosis not present

## 2018-11-22 DIAGNOSIS — I639 Cerebral infarction, unspecified: Secondary | ICD-10-CM | POA: Diagnosis not present

## 2018-11-22 LAB — CBC
HCT: 42 % (ref 36.0–46.0)
Hemoglobin: 13.7 g/dL (ref 12.0–15.0)
MCH: 28.6 pg (ref 26.0–34.0)
MCHC: 32.6 g/dL (ref 30.0–36.0)
MCV: 87.7 fL (ref 80.0–100.0)
Platelets: 178 10*3/uL (ref 150–400)
RBC: 4.79 MIL/uL (ref 3.87–5.11)
RDW: 13.7 % (ref 11.5–15.5)
WBC: 6.2 10*3/uL (ref 4.0–10.5)
nRBC: 0 % (ref 0.0–0.2)

## 2018-11-22 LAB — COMPREHENSIVE METABOLIC PANEL
ALT: 14 U/L (ref 0–44)
AST: 15 U/L (ref 15–41)
Albumin: 4 g/dL (ref 3.5–5.0)
Alkaline Phosphatase: 68 U/L (ref 38–126)
Anion gap: 11 (ref 5–15)
BUN: 16 mg/dL (ref 6–20)
CO2: 27 mmol/L (ref 22–32)
Calcium: 9.6 mg/dL (ref 8.9–10.3)
Chloride: 99 mmol/L (ref 98–111)
Creatinine, Ser: 0.88 mg/dL (ref 0.44–1.00)
GFR calc Af Amer: >60 mL/min (ref >60)
GFR calc non Af Amer: >60 mL/min (ref >60)
Glucose, Bld: 94 mg/dL (ref 70–99)
Potassium: 3.6 mmol/L (ref 3.5–5.1)
Sodium: 137 mmol/L (ref 135–145)
Total Bilirubin: 0.4 mg/dL (ref 0.3–1.2)
Total Protein: 8.4 g/dL — ABNORMAL HIGH (ref 6.5–8.1)

## 2018-11-22 LAB — DIFFERENTIAL
Abs Immature Granulocytes: 0.01 10*3/uL (ref 0.00–0.07)
Basophils Absolute: 0.1 10*3/uL (ref 0.0–0.1)
Basophils Relative: 1 %
Eosinophils Absolute: 0.3 10*3/uL (ref 0.0–0.5)
Eosinophils Relative: 5 %
Immature Granulocytes: 0 %
Lymphocytes Relative: 39 %
Lymphs Abs: 2.4 10*3/uL (ref 0.7–4.0)
Monocytes Absolute: 0.5 10*3/uL (ref 0.1–1.0)
Monocytes Relative: 7 %
Neutro Abs: 3 10*3/uL (ref 1.7–7.7)
Neutrophils Relative %: 48 %

## 2018-11-22 LAB — SARS CORONAVIRUS 2 BY RT PCR (HOSPITAL ORDER, PERFORMED IN ~~LOC~~ HOSPITAL LAB): SARS Coronavirus 2: NEGATIVE

## 2018-11-22 LAB — PROTIME-INR
INR: 1.1 (ref 0.8–1.2)
Prothrombin Time: 14.5 seconds (ref 11.4–15.2)

## 2018-11-22 LAB — ETHANOL: Alcohol, Ethyl (B): <10 mg/dL (ref <10)

## 2018-11-22 LAB — APTT: aPTT: 32 seconds (ref 24–36)

## 2018-11-22 LAB — CBG MONITORING, ED: Glucose-Capillary: 77 mg/dL (ref 70–99)

## 2018-11-22 LAB — PREGNANCY, URINE: Preg Test, Ur: NEGATIVE

## 2018-11-22 MED ORDER — LABETALOL HCL 5 MG/ML IV SOLN
10.0000 mg | Freq: Once | INTRAVENOUS | Status: AC
  Administered 2018-11-22: 10 mg via INTRAVENOUS
  Filled 2018-11-22: qty 4

## 2018-11-22 MED ORDER — LABETALOL HCL 5 MG/ML IV SOLN
10.0000 mg | INTRAVENOUS | Status: DC | PRN
  Filled 2018-11-22: qty 4

## 2018-11-22 NOTE — ED Notes (Signed)
Text paged Neurology Dr. Cheral Marker to request admit order

## 2018-11-22 NOTE — ED Notes (Signed)
CBG is 76 

## 2018-11-22 NOTE — ED Notes (Signed)
ED Provider at bedside. 

## 2018-11-22 NOTE — ED Triage Notes (Signed)
Pt c/o feeling "woozy" and "not feeling like myself." Pt states "I couldn't remember my password." Pt states symptoms started Tuesday. Denies lightheaded or dizziness. Pt A/Ox4 in triage.

## 2018-11-22 NOTE — ED Provider Notes (Signed)
[facility] EMERGENCY DEPARTMENT Provider Note   CSN: [id number] Arrival date & time: 11/22/18  [id number]    History   Chief Complaint No chief complaint on file.   HPI Kristy Vasquez is a 48 y.o. female.     HPI   48 year old female with history of hypertension, hemorrhagic CVA, presents with concern for difficulty speaking and finding words, feeling of fatigue since Tuesday.   Denies head trauma, headaches.  Denies numbness, weakness, difficulty walking, change in vision or dizziness.  Denies feeling lightheaded.  Denies infectious symptoms, including no cough, no urinary symptoms, no sore throat.  No nausea, vomiting or diarrhea.  Reports she is had a low appetite this week.  And feeling "not like myself" she has not had any recent medication changes.  Denies smoking, alcohol use or drug use.  No fevers or loss of smell or taste.    Past Medical History:  Diagnosis Date  . Hypertension 2013  . Obesity   . Stroke [facility](HCC) 2013   hemorrhagic stroke    Patient Active Problem List   Diagnosis Date Noted  . Intraparenchymal hemorrhage of brain (HCC) 11/22/2018  . Cerebral hemorrhage (HCC) 08/30/2011  . Accelerated hypertension 08/30/2011  . Obesity, Class III, BMI 40-49.9 (morbid obesity) (HCC) 08/30/2011    Past Surgical History:  Procedure Laterality Date  . NOVASURE ABLATION    . TUBAL LIGATION       OB History   No obstetric history on file.      Home Medications    Prior to Admission medications   Medication Sig Start Date End Date Taking? Authorizing Provider  Olmesartan-amLODIPine-HCTZ 20-5-12.5 MG TABS Take by mouth.   Yes [provider]  ALPRAZolam Prudy Feeler(XANAX) 1 MG tablet 1/2-1 tablet prn for nerves, up to TID Patient not taking: Reported on 10/25/2017 02/18/14   Tysinger, Kermit Baloavid S, PA-C  amLODipine (NORVASC) 10 MG tablet TAKE 1 TABLET (10 MG TOTAL) BY MOUTH DAILY. 10/25/14   Tysinger, Kermit Baloavid S, PA-C  amLODIPine-Valsartan-HCTZ [id number] MG TABS  Take by mouth.    [provider]  aspirin EC 81 MG tablet Take 1 tablet (81 mg total) by mouth daily. Patient not taking: Reported on 10/25/2017 02/18/14   Tysinger, Kermit Baloavid S, PA-C  atorvastatin (LIPITOR) 20 MG tablet Take 1 tablet (20 mg total) by mouth daily. Patient not taking: Reported on 10/25/2017 04/26/14   Tysinger, Kermit Baloavid S, PA-C  benzonatate (TESSALON) 100 MG capsule  08/17/17   [provider]  cetirizine (ZYRTEC) 10 MG tablet Take 10 mg by mouth daily.    [provider]  gabapentin (NEURONTIN) 100 MG capsule TAKE 1 CAPSULE (100 MG TOTAL) BY MOUTH 2 (TWO) TIMES DAILY. Patient not taking: Reported on 10/25/2017 07/12/14   Genia Delysinger, David S, PA-C  [address] [phone](90 Base) MCG/ACT AEPB  08/17/17   [provider]  spironolactone (ALDACTONE) 25 MG tablet Take 1 tablet (25 mg total) by mouth daily. 02/18/14   Tysinger, Kermit Baloavid S, PA-C  valsartan-hydrochlorothiazide (DIOVAN-HCT) 320-25 MG per tablet Take 1 tablet by mouth daily. 02/18/14   Tysinger, Kermit Baloavid S, PA-C    Family History Family History  Problem Relation Age of Onset  . Cancer Other   . Diabetes Other     Social History Social History   Tobacco Use  . Smoking status: Never Smoker  . Smokeless tobacco: Never Used  Substance Use Topics  . Alcohol use: Yes  . Drug use: No     Allergies  Patient has no known allergies.   Review of Systems Review of Systems  Constitutional: Positive for appetite change and fatigue. Negative for fever.  HENT: Negative for sore throat.   Eyes: Negative for visual disturbance.  Respiratory: Negative for cough and shortness of breath.   Cardiovascular: Negative for chest pain.  Gastrointestinal: Negative for abdominal pain, diarrhea, nausea and vomiting.  Genitourinary: Negative for difficulty urinating and dysuria.  Musculoskeletal: Negative for back pain and neck pain.  Skin: Negative for rash.  Neurological: Positive for speech difficulty.  Negative for dizziness, syncope, facial asymmetry, weakness, light-headedness, numbness and headaches.     Physical Exam Updated Vital Signs BP (!) 145/87 (BP Location: Left Arm)   Pulse 68   Temp 98.2 F (36.8 C) (Oral)   Resp 16   Ht 5\' 2"  (1.575 m)   Wt 99.8 kg   LMP 10/22/2018   SpO2 99%   BMI 40.24 kg/m   Physical Exam Vitals signs and nursing note reviewed.  Constitutional:      General: She is not in acute distress.    Appearance: She is well-developed. She is not diaphoretic.  HENT:     Head: Normocephalic and atraumatic.  Eyes:     General: No visual field deficit.    Conjunctiva/sclera: Conjunctivae normal.  Neck:     Musculoskeletal: Normal range of motion.  Cardiovascular:     Rate and Rhythm: Normal rate and regular rhythm.     Heart sounds: Normal heart sounds. No murmur. No friction rub. No gallop.   Pulmonary:     Effort: Pulmonary effort is normal. No respiratory distress.     Breath sounds: No wheezing.  Abdominal:     General: There is no distension.     Palpations: Abdomen is soft.     Tenderness: There is no abdominal tenderness. There is no guarding.  Musculoskeletal:        General: No tenderness.  Skin:    General: Skin is warm and dry.     Findings: No erythema or rash.  Neurological:     Mental Status: She is alert and oriented to person, place, and time.     GCS: GCS eye subscore is 4. GCS verbal subscore is 5. GCS motor subscore is 6.     Cranial Nerves: Cranial nerves are intact. No cranial nerve deficit, dysarthria or facial asymmetry.     Sensory: Sensation is intact. No sensory deficit.     Motor: Motor function is intact. No weakness or pronator drift.     Coordination: Coordination is intact. Finger-Nose-Finger Test normal.      ED Treatments / Results  Labs (all labs ordered are listed, but only abnormal results are displayed) Labs Reviewed  COMPREHENSIVE METABOLIC PANEL - Abnormal; Notable for the following components:       Result Value   Total Protein 8.4 (*)    All other components within normal limits  SARS CORONAVIRUS 2 (HOSPITAL ORDER, [place] [facility])  PREGNANCY, URINE  ETHANOL  PROTIME-INR  APTT  CBC  DIFFERENTIAL  RAPID URINE DRUG SCREEN, HOSP PERFORMED  URINALYSIS, ROUTINE W REFLEX MICROSCOPIC  CBG MONITORING, ED    EKG EKG Interpretation  Date/Time:  Saturday November 22 2018 20:01:35 EDT Ventricular Rate:  68 PR Interval:    QRS Duration: 91 QT Interval:  399 QTC Calculation: 425 R Axis:   26 Text Interpretation:  Sinus rhythm Anterior infarct, old Baseline wander in lead(s) II aVF No significant change since  last tracing Confirmed by Alvira MondaySchlossman, Nihaal Friesen ([id number]) on 11/22/2018 9:07:44 PM   Radiology Ct Head Wo Contrast  Result Date: 11/22/2018 CLINICAL DATA:  Confusion 3 days. Hypertension. History of previous stroke. EXAM: CT HEAD WITHOUT CONTRAST TECHNIQUE: Contiguous axial images were obtained from the base of the skull through the vertex without intravenous contrast. COMPARISON:  CT 08/25/2011 and MRI brain 08/25/2011 FINDINGS: Brain: The ventricles and cisterns are within normal. CSF spaces are normal. There is a focus of acute hemorrhage projecting over the region of the left thalamus/basal ganglia measuring 2.5 x 1.8 cm in AP and transverse dimension. There is adjacent edema. Findings are compatible with hypertensive stroke with acute hemorrhage. Similar findings present on the right side on the previous study from 2013. No intraventricular hemorrhage. Minimal local mass effect. No significant midline shift. Old lacunar infarct over the right thalamus. Vascular: No hyperdense vessel or unexpected calcification. Skull: Normal. Negative for fracture or focal lesion. Sinuses/Orbits: No acute finding. Other: None. IMPRESSION: Acute hemorrhage over the left thalamus/basal ganglia measuring 1.8 x 2.5 cm with adjacent edema and minimal local mass effect compatible with  hypertensive bleed. No midline shift. Critical Value/emergent results were called by telephone at the time of interpretation on 11/22/2018 at 9:38 pm to Dr. Alvira MondayERIN Meryle Pugmire , who verbally acknowledged these results. Electronically Signed   By: Elberta Fortisaniel  Boyle M.D.   On: 11/22/2018 21:38    Procedures .Critical Care Performed by: Alvira MondaySchlossman, Shivaan Tierno, MD Authorized by: Alvira MondaySchlossman, Jaivian Battaglini, MD   Critical care provider statement:    Critical care time (minutes):  45   Critical care was time spent personally by me on the following activities:  Discussions with consultants, evaluation of patient's response to treatment, examination of patient, ordering and performing treatments and interventions, ordering and review of laboratory studies, ordering and review of radiographic studies, pulse oximetry, re-evaluation of patient's condition, obtaining history from patient or surrogate and review of old charts   (including critical care time)  Medications Ordered in ED Medications  labetalol (NORMODYNE) injection 10 mg (has no administration in time range)  labetalol (NORMODYNE) injection 10 mg (10 mg Intravenous Given 11/22/18 2215)     Initial Impression / Assessment and Plan / ED Course  I have reviewed the triage vital signs and the nursing notes.  Pertinent labs & imaging results that were available during my care of the patient were reviewed by me and considered in my medical decision making (see chart for details).        48 year old female with history of hypertension, hemorrhagic CVA in 2013, presents with concern for difficulty speaking and finding words, feeling of fatigue since Tuesday.  Labs are without anemia or electrolyte abnormalities.  CT head shows acute hemorrhage over the left thalamus/basal ganglia consistent with hypertensive bleed.  BP increased to above 140 and given labetalol with improvement. Not on anticoagulation. Consulted with Dr. Otelia LimesLindzen of Neurology who will admit her to the  ICU for further care.    Final Clinical Impressions(s) / ED Diagnoses   Final diagnoses:  Hemorrhagic stroke [facility](HCC)    ED Discharge Orders    None       Alvira MondaySchlossman, Elfego Giammarino, MD 11/23/18 [phone]

## 2018-11-22 NOTE — ED Notes (Signed)
Called [facility] -advised we had not received a call back from Neurology as of yet - Tammy stated she was going to repage them.

## 2018-11-22 NOTE — ED Notes (Addendum)
Pt was able to provide urine sample but it's not enough to run the tests. EMT encouraged pt to try again when she can.

## 2018-11-23 ENCOUNTER — Inpatient Hospital Stay (HOSPITAL_COMMUNITY): Payer: 59

## 2018-11-23 DIAGNOSIS — G936 Cerebral edema: Secondary | ICD-10-CM | POA: Insufficient documentation

## 2018-11-23 DIAGNOSIS — I610 Nontraumatic intracerebral hemorrhage in hemisphere, subcortical: Principal | ICD-10-CM

## 2018-11-23 DIAGNOSIS — I361 Nonrheumatic tricuspid (valve) insufficiency: Secondary | ICD-10-CM

## 2018-11-23 DIAGNOSIS — R4182 Altered mental status, unspecified: Secondary | ICD-10-CM

## 2018-11-23 LAB — URINALYSIS, ROUTINE W REFLEX MICROSCOPIC
Bilirubin Urine: NEGATIVE
Glucose, UA: NEGATIVE mg/dL
Ketones, ur: NEGATIVE mg/dL
Leukocytes,Ua: NEGATIVE
Nitrite: NEGATIVE
Protein, ur: NEGATIVE mg/dL
Specific Gravity, Urine: >1.03 — ABNORMAL HIGH (ref 1.005–1.030)
pH: 5.5 (ref 5.0–8.0)

## 2018-11-23 LAB — MRSA PCR SCREENING: MRSA by PCR: NEGATIVE

## 2018-11-23 LAB — RAPID URINE DRUG SCREEN, HOSP PERFORMED
Amphetamines: NOT DETECTED
Barbiturates: NOT DETECTED
Benzodiazepines: NOT DETECTED
Cocaine: NOT DETECTED
Opiates: NOT DETECTED
Tetrahydrocannabinol: NOT DETECTED

## 2018-11-23 LAB — URINALYSIS, MICROSCOPIC (REFLEX)

## 2018-11-23 LAB — ECHOCARDIOGRAM COMPLETE
Height: 62 in
Weight: 3795.44 oz

## 2018-11-23 LAB — HIV ANTIBODY (ROUTINE TESTING W REFLEX): HIV Screen 4th Generation wRfx: NONREACTIVE

## 2018-11-23 MED ORDER — VALSARTAN-HYDROCHLOROTHIAZIDE 320-25 MG PO TABS: 1.0000 | ORAL_TABLET | Freq: Every day | ORAL | Status: DC

## 2018-11-23 MED ORDER — IRBESARTAN 300 MG PO TABS
300.0000 mg | ORAL_TABLET | Freq: Every day | ORAL | Status: DC
  Administered 2018-11-23 – 2018-11-25 (×3): 300 mg via ORAL
  Filled 2018-11-23 (×2): qty 2
  Filled 2018-11-23: qty 1

## 2018-11-23 MED ORDER — LABETALOL HCL 5 MG/ML IV SOLN
20.0000 mg | Freq: Once | INTRAVENOUS | Status: AC
  Administered 2018-11-23: 20 mg via INTRAVENOUS

## 2018-11-23 MED ORDER — AMLODIPINE BESYLATE 10 MG PO TABS
10.0000 mg | ORAL_TABLET | Freq: Every day | ORAL | Status: DC
  Administered 2018-11-23 – 2018-11-25 (×3): 10 mg via ORAL
  Filled 2018-11-23 (×3): qty 1

## 2018-11-23 MED ORDER — SENNOSIDES-DOCUSATE SODIUM 8.6-50 MG PO TABS
1.0000 | ORAL_TABLET | Freq: Two times a day (BID) | ORAL | Status: DC
  Administered 2018-11-23 – 2018-11-25 (×4): 1 via ORAL
  Filled 2018-11-23 (×4): qty 1

## 2018-11-23 MED ORDER — ACETAMINOPHEN 325 MG PO TABS: 650.0000 mg | ORAL_TABLET | ORAL | Status: DC | PRN

## 2018-11-23 MED ORDER — STROKE: EARLY STAGES OF RECOVERY BOOK
Freq: Once | Status: AC
  Administered 2018-11-23: 03:00:00
  Filled 2018-11-23: qty 1

## 2018-11-23 MED ORDER — ACETAMINOPHEN 160 MG/5ML PO SOLN: 650.0000 mg | ORAL | Status: DC | PRN

## 2018-11-23 MED ORDER — CHLORHEXIDINE GLUCONATE CLOTH 2 % EX PADS
6.0000 | MEDICATED_PAD | Freq: Every day | CUTANEOUS | Status: DC
  Administered 2018-11-23 – 2018-11-25 (×3): 6 via TOPICAL

## 2018-11-23 MED ORDER — PANTOPRAZOLE SODIUM 40 MG IV SOLR
40.0000 mg | Freq: Every day | INTRAVENOUS | Status: DC
  Administered 2018-11-23: 40 mg via INTRAVENOUS
  Filled 2018-11-23: qty 40

## 2018-11-23 MED ORDER — HYDROCHLOROTHIAZIDE 25 MG PO TABS
25.0000 mg | ORAL_TABLET | Freq: Every day | ORAL | Status: DC
  Administered 2018-11-23 – 2018-11-25 (×3): 25 mg via ORAL
  Filled 2018-11-23 (×3): qty 1

## 2018-11-23 MED ORDER — ACETAMINOPHEN 650 MG RE SUPP: 650.0000 mg | RECTAL | Status: DC | PRN

## 2018-11-23 MED ORDER — HYDRALAZINE HCL 20 MG/ML IJ SOLN: 20.0000 mg | Freq: Three times a day (TID) | INTRAMUSCULAR | Status: DC | PRN

## 2018-11-23 MED ORDER — PANTOPRAZOLE SODIUM 40 MG PO TBEC
40.0000 mg | DELAYED_RELEASE_TABLET | Freq: Every day | ORAL | Status: DC
  Administered 2018-11-24: 40 mg via ORAL
  Filled 2018-11-23: qty 1

## 2018-11-23 MED ORDER — GADOBUTROL 1 MMOL/ML IV SOLN
10.0000 mL | Freq: Once | INTRAVENOUS | Status: AC | PRN
  Administered 2018-11-23: 10 mL via INTRAVENOUS

## 2018-11-23 MED ORDER — LORAZEPAM 2 MG/ML IJ SOLN
1.0000 mg | Freq: Once | INTRAMUSCULAR | Status: AC
  Administered 2018-11-23: 1 mg via INTRAVENOUS
  Filled 2018-11-23: qty 1

## 2018-11-23 MED ORDER — SPIRONOLACTONE 25 MG PO TABS
25.0000 mg | ORAL_TABLET | Freq: Every day | ORAL | Status: DC
  Administered 2018-11-23 – 2018-11-25 (×3): 25 mg via ORAL
  Filled 2018-11-23 (×3): qty 1

## 2018-11-23 MED ORDER — CLEVIDIPINE BUTYRATE 0.5 MG/ML IV EMUL
0.0000 mg/h | INTRAVENOUS | Status: DC
  Administered 2018-11-23: 1 mg/h via INTRAVENOUS
  Administered 2018-11-23: 8 mg/h via INTRAVENOUS
  Filled 2018-11-23 (×2): qty 50

## 2018-11-23 NOTE — Progress Notes (Signed)
PT Cancellation Note  Patient Details Name: Kristy Vasquez MRN: [id number] DOB: 09-Feb-1971   Cancelled Treatment:    Reason Eval/Treat Not Completed: Active bedrest order   Ellamae Sia, PT, DPT Acute Rehabilitation Services Pager [phone] Office [phone]    Willy Eddy 11/23/2018, 7:45 AM

## 2018-11-23 NOTE — Evaluation (Signed)
Physical Therapy Evaluation Patient Details Name: Kristy Vasquez MRN: [id number] DOB: 1970-11-14 Today's Date: 11/23/2018   History of Present Illness  48 y.o. female with HTN, obesity and prior hemorrhagic stroke in 2013 who presented to [facility] on Saturday evening with feeling "woozy" and memory changes as well as word finding difficulty and fatigue since Tuesday. CT [facility] revealed an acute hemorrhage over the left thalamus/basal ganglia.  Clinical Impression  Pt admitted with above diagnosis. Pt currently with functional limitations due to the deficits listed below (see PT Problem List). Pt asleep upon entry, difficult to arouse and lethargic throughout initial portion of evaluation. Ambulating 100 feet with no assistive device and supervision. Pt demonstrating decreased gait speed, attention and awareness of deficits. Will continue to assess. Pt will benefit from skilled PT to increase their independence and safety with mobility to allow discharge to the venue listed below.       Follow Up Recommendations Outpatient PT;Supervision/Assistance - 24 hour    Equipment Recommendations  None recommended by PT    Recommendations for Other Services       Precautions / Restrictions Precautions Precautions: Fall Precaution Comments: lethargic Restrictions Weight Bearing Restrictions: No      Mobility  Bed Mobility Overal bed mobility: Modified Independent Bed Mobility: Supine to Sit     Supine to sit: Supervision     General bed mobility comments: supervision for safety. no physical A needed. HOB elevated for trunk support  Transfers Overall transfer level: Modified independent Equipment used: None Transfers: Sit to/from Stand Sit to Stand: Min guard         General transfer comment: Min Guard A for safety  Ambulation/Gait Ambulation/Gait assistance: Chief Operating Officerupervision Gait Distance (Feet): 100 Feet Assistive device: None Gait Pattern/deviations: Step-through  pattern;Decreased stride length Gait velocity: decreased   General Gait Details: Slow speed, not able to significantly adjust. Performed head turns, stops/starts, stepping over obstacles and full turns without overt LOB  Stairs            Wheelchair Mobility    Modified Rankin (Stroke Patients Only) Modified Rankin (Stroke Patients Only) Pre-Morbid Rankin Score: No symptoms Modified Rankin: Moderately severe disability     Balance Overall balance assessment: No apparent balance deficits (not formally assessed)                                           Pertinent Vitals/Pain Pain Assessment: No/denies pain    Home Living Family/patient expects to be discharged to:: Private residence Living Arrangements: Children;Other relatives(daughter, 711 y.o. 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     Hand Dominance   Dominant Hand: Right    Extremity/Trunk Assessment   Upper Extremity Assessment Upper Extremity Assessment: Overall WFL for tasks assessed    Lower Extremity Assessment Lower Extremity Assessment: Overall WFL for tasks assessed    Cervical / Trunk Assessment Cervical / Trunk Assessment: Other exceptions Cervical / Trunk Exceptions: Increased body habitus  Communication   Communication: (Slow speech pattern; lethargic)  Cognition Arousal/Alertness: Lethargic Behavior During Therapy: Flat affect Overall Cognitive Status: Impaired/Different from baseline Area of Impairment: Memory;Attention;Following commands;Safety/judgement;Awareness;Problem  solving                   Current Attention Level: Sustained Memory: Decreased short-term memory Following Commands: Follows  one step commands with increased time Safety/Judgement: Decreased awareness of safety;Decreased awareness of deficits("I am here for high blood pressure") Awareness: Emergent Problem Solving: Slow processing;Difficulty sequencing General Comments: Pt presenting with lethargy and at first, difficulty opening her eyes. Pt requiring increased time and cues throughout.       General Comments General comments (skin integrity, edema, etc.): VSS.     Exercises     Assessment/Plan    PT Assessment Patient needs continued PT services  PT Problem List Decreased mobility;Decreased cognition       PT Treatment Interventions Gait training;Stair training;Functional mobility training;Therapeutic activities;Therapeutic exercise;Balance training;Patient/family education;Cognitive remediation    PT Goals (Current goals can be found in the Care Plan section)  Acute Rehab PT Goals Patient Stated Goal: "Go home to my daughter and granddaughter" PT Goal Formulation: With patient Time For Goal Achievement: 12/07/18 Potential to Achieve Goals: Good    Frequency Min 4X/week   Barriers to discharge        Co-evaluation PT/OT/SLP Co-Evaluation/Treatment: Yes Reason for Co-Treatment: To address functional/ADL transfers PT goals addressed during session: Mobility/safety with mobility OT goals addressed during session: ADL's and self-care       AM-PAC PT "6 Clicks" Mobility  Outcome Measure Help needed turning from your back to your side while in a flat bed without using bedrails?: None Help needed moving from lying on your back to sitting on the side of a flat bed without using bedrails?: None Help needed moving to and from a bed to a chair (including a wheelchair)?: None Help needed standing up from a chair using your arms (e.g., wheelchair or bedside chair)?: None Help needed to walk in hospital room?: None Help needed climbing 3-5 steps with a railing? : A Little 6 Click Score: 23    End  of Session Equipment Utilized During Treatment: Gait belt Activity Tolerance: Patient tolerated treatment well Patient left: in chair;with call bell/phone within reach;with chair alarm set Nurse Communication: Mobility status PT Visit Diagnosis: Other symptoms and signs involving the nervous system (R29.898)    Time: 3329-5188 PT Time Calculation (min) (ACUTE ONLY): 25 min   Charges:   PT Evaluation $PT Eval Moderate Complexity: 1 Mod          Ellamae Sia, PT, DPT Acute Rehabilitation Services Pager [phone] Office [phone]   Willy Eddy 11/23/2018, 2:22 PM

## 2018-11-23 NOTE — Progress Notes (Signed)
STROKE TEAM PROGRESS NOTE   HISTORY OF PRESENT ILLNESS (per Dr Raj Janus is an 48 y.o. female with HTN, obesity and prior hemorrhagic stroke in 2013 who presented to [facility] on Saturday evening with feeling "woozy" and memory changes as well as word finding difficulty and fatigue since Tuesday. She denied head trauma or headaches. Also denied limb weakness or limb numbness. No N/V, diarrhea but has had decreased appetite for one week. No fevers.   CT at the [facility] ED revealed an acute hemorrhage over the left thalamus/basal ganglia measuring 1.8 x 2.5 cm with adjacent edema and minimal local mass effect compatible with hypertensive bleed. No midline shift.   She was transferred to [facility] for post-hemorrhage monitoring and treatment.    SUBJECTIVE (INTERVAL HISTORY) Her RN is at the bedside.   I have personally reviewed history of presenting illness with the patient.  She has longstanding history of hypertension but states she has been compliant with her medicines but MRI shows multiple microhemorrhages in addition to stable appearance of current 1.6 x 1.2 cm left thalamic hemorrhage with mild cytotoxic edema and minimum local mass-effect and no intraventricular extension or midline shift.  Blood pressure adequately controlled.   OBJECTIVE Vitals:   11/23/18 0400 11/23/18 0500 11/23/18 0600 11/23/18 0700  BP: 128/71 110/72 132/85 130/85  Pulse: 86 81 70 72  Resp: 16 15 15 15   Temp:      TempSrc:      SpO2: 99% 98% 100% 99%  Weight:      Height:        CBC:  Recent Labs  Lab 11/22/18 2020  WBC 6.2  NEUTROABS 3.0  HGB 13.7  HCT 42.0  MCV 87.7  PLT 272    Basic Metabolic Panel:  Recent Labs  Lab 11/22/18 2020  NA 137  K 3.6  CL 99  CO2 27  GLUCOSE 94  BUN 16  CREATININE 0.88  CALCIUM 9.6    Lipid Panel: No results found for: CHOL, TRIG, HDL, CHOLHDL, VLDL, LDLCALC HgbA1c:  Lab Results  Component Value Date   HGBA1C 5.9 (H) 02/18/2014   Urine Drug Screen:      Component Value Date/Time   LABOPIA NONE DETECTED 11/23/2018 0018   COCAINSCRNUR NONE DETECTED 11/23/2018 0018   LABBENZ NONE DETECTED 11/23/2018 0018   AMPHETMU NONE DETECTED 11/23/2018 0018   THCU NONE DETECTED 11/23/2018 0018   LABBARB NONE DETECTED 11/23/2018 0018    Alcohol Level     Component Value Date/Time   ETH <10 11/22/2018 2020    IMAGING  Ct Head Wo Contrast 11/22/2018 IMPRESSION:  Acute hemorrhage over the left thalamus/basal ganglia measuring 1.8 x 2.5 cm with adjacent edema and minimal local mass effect compatible with hypertensive bleed. No midline shift.    Mr Jodene Nam Head Wo Contrast 11/23/2018 IMPRESSION:   MRI HEAD  IMPRESSION:  1. 16 x 11 x 12 mm acute intraparenchymal hemorrhage centered at the left thalamus, relatively stable from previous. Associated regional mass effect and edema with up to 4 mm of localized left-to-right midline shift. No hydrocephalus or ventricular trapping. No intraventricular extension of hemorrhage. Finding favored to be hypertensive in nature.  2. Multiple additional chronic micro hemorrhages centered about the deep gray nuclei, brainstem, and cerebellum, most characteristic for poorly controlled hypertension.  3. Underlying chronic microvascular ischemic disease with scattered remote lacunar infarcts as above.   MRA HEAD  IMPRESSION:  1. Technically limited exam due to motion artifact.  2. Grossly negative intracranial  MRA. No large vessel occlusion or hemodynamically significant stenosis. No vascular abnormality seen underlying the left thalamic hemorrhage.    Transthoracic Echocardiogram  00/00/2020 Pending    Bilateral Carotid Dopplers  00/00/2020 Pending   EKG - SR rate 68 BPM. Possible old anterior MI. (See cardiology reading for complete details)    PHYSICAL EXAM Blood pressure 130/85, pulse 72, temperature 98.4 F (36.9 C), temperature source Oral, resp. rate 15, height 5\' 2"  (1.575 m), weight 107.6 kg,  last menstrual period 10/22/2018, SpO2 99 %. Pleasant middle-aged African-American lady not in distress. . Afebrile. Head is nontraumatic. Neck is supple without bruit.    Cardiac exam no murmur or gallop. Lungs are clear to auscultation. Distal pulses are well felt.  Neurological Exam :  Awake  Alert oriented x 3.  Slightly nonfluent speech but no paraphasic errors or word finding difficulties.Marland Kitchen.eye movements full without nystagmus.fundi were not visualized. Vision acuity and fields appear normal. Hearing is normal. Palatal movements are normal. Face symmetric. Tongue midline. Normal strength, tone, reflexes and coordination. Normal sensation. Gait deferred.  NIHSS 0    ASSESSMENT/PLAN Ms. Bary Lerichehelma Ledo is a 48 y.o. female with history of HTN, obesity and prior hemorrhagic stroke in 2013 who [facility] feeling "woozy" with memory changes, word finding difficulty and fatigue. She did not receive IV t-PA due to hemorrhage.  Hemorrhagic stroke: acute hemorrhage over the left thalamus/basal ganglia measuring 1.8 x 2.5 cm - likely due to htn.  Resultant  No deficitsCT head - Acute hemorrhage over the left thalamus/basal ganglia measuring 1.8 x 2.5 cm with adjacent edema and minimal local mass effect compatible with hypertensive bleed.  MRI head - 16 x 11 x 12 mm acute intraparenchymal hemorrhage centered at the left thalamus - 4 mm of localized left-to-right midline shift - multiple additional chronic micro hemorrhages - scattered remote lacunar infarcts.  MRA head - grossly normal  CTA H&N - not ordered.  Carotid Doppler - pending  2D Echo - pending  Loyal JacobsonSars Corona Virus 2  - negative  LDL - not ordered  HgbA1c  - not ordered  UDS  - negative  VTE prophylaxis - SCDs  Diet  - Heart healthy with thin liquids.  No antithrombotic prior to admission, now on No antithrombotic  Ongoing aggressive stroke risk factor management  Therapy recommendations:  pending  Disposition:   Pending  Hypertension  Stable (cleviprex)  SBP goal < 160 mm Hg . Long-term BP goal normotensive   Other Stroke Risk Factors  ETOH use, advised to drink no more than 1 alcoholic beverage per day.  Obesity, Body mass index is 43.39 kg/m., recommend weight loss, diet and exercise as appropriate   Prior hemorrhagic stroke in 2013. Previous lacunar infarcts by imaging.   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  She presented with small left thalamic hemorrhage with mild cytotoxic edema but without midline shift or intraventricular extension.  MRI scan shows stable appearance and several remote age microhemorrhages indicating longstanding hypertensive disease.  Recommend close neurological monitoring and strict blood pressure control with systolic blood pressure below 284140 for 24 hours followed by below  160.  Wean Cardene drip and resume home blood pressure medications and use PRN hydralazine to maintain systolic blood pressure goal.  Mobilize out of bed.  Therapy consults.  Resume diet if able to swallow safely.  Long discussion with the patient and nurse and answered questions. This patient is critically ill and at significant risk of neurological worsening, death and care requires constant monitoring of vital signs, hemodynamics,respiratory and cardiac monitoring, extensive review of multiple databases, frequent neurological assessment, discussion with family, other specialists and medical decision making of high complexity.I have made any additions or clarifications directly to the above note.This  critical care time does not reflect procedure time, or teaching time or supervisory time of PA/NP/Med Resident etc but could involve care discussion time.  I spent 30 minutes of neurocritical care time  in the care of  this patient.     Delia HeadyPramod Zuleica Seith, MD Medical Director [facility] Pager: [phone] 11/23/2018 11:11 AM   To contact Stroke Continuity provider, please refer to [email or web address]. After hours, contact General Neurology

## 2018-11-23 NOTE — Progress Notes (Signed)
OT Cancellation Note  Patient Details Name: Kristy Vasquez MRN: [id number] DOB: Mar 15, 1971   Cancelled Treatment:    Reason Eval/Treat Not Completed: Active bedrest order. Will return as schedule allows. Thank you.  [place], OTR/L Acute Rehab Pager: [phone] Office: [phone] 11/23/2018, 6:52 AM

## 2018-11-23 NOTE — Progress Notes (Signed)
VASCULAR LAB PRELIMINARY  PRELIMINARY  PRELIMINARY  PRELIMINARY  Carotid duplex completed.    Preliminary report:  See CV proc for preliminary results.   Jovannie Ulibarri, RVT 11/23/2018, 5:47 PM

## 2018-11-23 NOTE — Evaluation (Signed)
Occupational Therapy Evaluation Patient Details Name: Kristy Vasquez MRN: [id number] DOB: 02-Feb-1971 Today's Date: 11/23/2018    History of Present Illness 48 y.o. female with HTN, obesity and prior hemorrhagic stroke in 2013 who [facility] with feeling "woozy" and memory changes as well as word finding difficulty and fatigue since Tuesday. [facility] revealed an acute hemorrhage over the left thalamus/basal ganglia.   Clinical Impression   PTA, pt was living with her daughter and granddaughter and was working full-time. Pt currently requiring Min A for ADLs and functional mobility. Pt presenting with lethargy, poor cognition, and decreased balance impacting her safe performance of ADLs. Pt would benefit from further acute OT to facilitate safe dc. Recommend dc to home with follow up OP OT to optimize safety, independence with ADLs, and return to PLOF.      Follow Up Recommendations  Outpatient OT;Supervision/Assistance - 24 hour(Neuro OP OT)    Equipment Recommendations  None recommended by OT    Recommendations for Other Services PT consult     Precautions / Restrictions Precautions Precautions: Fall;Other (comment) Precaution Comments: lethargic      Mobility Bed Mobility Overal bed mobility: Needs Assistance Bed Mobility: Supine to Sit     Supine to sit: Supervision     General bed mobility comments: supervision for safety. no physical A needed. HOB elevated for trunk support  Transfers Overall transfer level: Needs assistance Equipment used: None Transfers: Sit to/from Stand Sit to Stand: Min guard         General transfer comment: Min Guard A for safety    Balance Overall balance assessment: No apparent balance deficits (not formally assessed)                                         ADL either performed or assessed with clinical judgement   ADL Overall ADL's : Needs assistance/impaired Eating/Feeding:  Supervision/ safety;Set up;Sitting   Grooming: Oral care;Min guard;Standing   Upper Body Bathing: Min guard;Sitting   Lower Body Bathing: Minimal assistance;Sit to/from stand   Upper Body Dressing : Min guard;Sitting   Lower Body Dressing: Minimal assistance;Sit to/from stand   Toilet Transfer: Min guard;Ambulation;Grab Paramedicbars;Regular Toilet Toilet Transfer Details (indicate cue type and reason): Min Guard A for safety         Functional mobility during ADLs: Minimal assistance General ADL Comments: Pt presenting with decreased balance and cognition.      Vision Baseline Vision/History: Wears glasses Wears Glasses: At all times Patient Visual Report: Other (comment)(Her glasses are broken and not with her) Vision Assessment?: Yes Eye Alignment: Impaired (comment)(Noting dysconjugate gaze with tracking to right) Ocular Range of Motion: Impaired-to be further tested in functional context(decreased smooth movement with right visual field) Tracking/Visual Pursuits: Decreased smoothness of horizontal tracking;Decreased smoothness of vertical tracking;Impaired - to be further tested in functional context Convergence: Within functional limits Visual Fields: No apparent deficits Diplopia Assessment: (denies diplopia) Additional Comments: Decreased smooth tracking; participarly in right visual field. Pt not noticing soap in right upper visualfield; not deficits noting with assessment     Perception     Praxis      Pertinent Vitals/Pain Pain Assessment: No/denies pain     Hand Dominance Right   Extremity/Trunk Assessment Upper Extremity Assessment Upper Extremity Assessment: Overall WFL for tasks assessed(Decreased FM and difficulty tying gown strings)   Lower Extremity Assessment  Lower Extremity Assessment: Defer to PT evaluation   Cervical / Trunk Assessment Cervical / Trunk Assessment: Other exceptions Cervical / Trunk Exceptions: Increased body habitus   Communication  Communication Communication: (Slow speech pattern; lethargic)   Cognition Arousal/Alertness: Lethargic Behavior During Therapy: Flat affect Overall Cognitive Status: Impaired/Different from baseline Area of Impairment: Memory;Attention;Following commands;Safety/judgement;Awareness;Problem solving                   Current Attention Level: Sustained Memory: Decreased short-term memory Following Commands: Follows one step commands with increased time Safety/Judgement: Decreased awareness of safety;Decreased awareness of deficits("I am here for high blood pressure") Awareness: Emergent Problem Solving: Slow processing;Difficulty sequencing General Comments: Pt presenting with lethargy and at first, difficulty opening her eyes. Pt requiring increased time and cues throughout.    General Comments  VSS.     Exercises     Shoulder Instructions      Home Living Family/patient expects to be discharged to:: Private residence Living Arrangements: Children;Other relatives(daughter, 61 y.o. granddaughter) Available Help at Discharge: Family Type of Home: Other(Comment)(Townhome) Home Access: Stairs to enter Technical brewer of Steps: 5   Home Layout: Two level Alternate Level Stairs-Number of Steps: flight   Bathroom Shower/Tub: Teacher, early years/pre: Standard     Home Equipment: None          Prior Functioning/Environment Level of Independence: Independent        Comments: Works in Probation officer Problem List: Decreased strength;Decreased range of motion;Decreased activity tolerance;Impaired balance (sitting and/or standing);Decreased cognition;Decreased safety awareness;Decreased knowledge of use of DME or AE;Decreased knowledge of precautions      OT Treatment/Interventions: Self-care/ADL training;Therapeutic exercise;Energy conservation;DME and/or AE instruction;Therapeutic activities;Patient/family education    OT Goals(Current  goals can be found in the care plan section) Acute Rehab OT Goals Patient Stated Goal: "Go home to my daughter and granddaughter" OT Goal Formulation: With patient Time For Goal Achievement: 12/07/18 Potential to Achieve Goals: Good  OT Frequency: Min 3X/week   Barriers to D/C:            Co-evaluation PT/OT/SLP Co-Evaluation/Treatment: Yes Reason for Co-Treatment: For patient/therapist safety;To address functional/ADL transfers   OT goals addressed during session: ADL's and self-care      AM-PAC OT "6 Clicks" Daily Activity     Outcome Measure Help from another person eating meals?: None Help from another person taking care of personal grooming?: A Little Help from another person toileting, which includes using toliet, bedpan, or urinal?: A Little Help from another person bathing (including washing, rinsing, drying)?: A Little Help from another person to put on and taking off regular upper body clothing?: A Little Help from another person to put on and taking off regular lower body clothing?: A Little 6 Click Score: 19   End of Session Equipment Utilized During Treatment: Gait belt Nurse Communication: Mobility status  Activity Tolerance: Patient limited by lethargy Patient left: in chair;with call bell/phone within reach;with chair alarm set  OT Visit Diagnosis: Unsteadiness on feet (R26.81);Other abnormalities of gait and mobility (R26.89);Muscle weakness (generalized) (M62.81);Pain                Time: 4132-4401 OT Time Calculation (min): 24 min Charges:  OT General Charges $OT Visit: 1 Visit OT Evaluation $OT Eval Moderate Complexity: 1 Mod  Daiana Vitiello MSOT, OTR/L Acute Rehab Pager: [phone] Office: [place] 11/23/2018, 1:10 PM

## 2018-11-23 NOTE — Progress Notes (Addendum)
Patient was transferred here from [place].  RN paged on call neuro MD as soon as patient arrived 0103 and requested BP medication to get patient systolic < 158.  Received orders for labetalol 20mg  and cleviprex gtt.  As soon as MD bp med orders were received RN gave labetalol first, as ordered, but BP was still elevated.  Subsequently started Cleviprex and rapidly titrated until systolic<140.

## 2018-11-23 NOTE — Progress Notes (Signed)
  Echocardiogram 2D Echocardiogram has been performed.  Kristy Vasquez 11/23/2018, 3:33 PM

## 2018-11-23 NOTE — H&P (Signed)
Admission H&P    Chief Complaint: Acute left thalamus hypertensive hemorrhage   HPI: Kristy Vasquez is an 48 y.o. female with HTN, obesity and prior hemorrhagic stroke in 2013 who presented to [facility] on Saturday evening with feeling "woozy" and memory changes as well as word finding difficulty and fatigue since Tuesday. She denied head trauma or headaches. Also denied limb weakness or limb numbness. No N/V, diarrhea but has had decreased appetite for one week. No fevers.   CT at the [facility] ED revealed an acute hemorrhage over the left thalamus/basal ganglia measuring 1.8 x 2.5 cm with adjacent edema and minimal local mass effect compatible with hypertensive bleed. No midline shift.   She was transferred to [facility] for post-hemorrhage monitoring and treatment.   Past Medical History:  Diagnosis Date  . Hypertension 2013  . Obesity   . Stroke [facility]) 2013   hemorrhagic stroke    Past Surgical History:  Procedure Laterality Date  . NOVASURE ABLATION    . TUBAL LIGATION      Family History  Problem Relation Age of Onset  . Cancer Other   . Diabetes Other    Social History:  reports that she has never smoked. She has never used smokeless tobacco. She reports current alcohol use. She reports that she does not use drugs.  Allergies: No Known Allergies  Medications Prior to Admission  Medication Sig Dispense Refill  . Olmesartan-amLODIPine-HCTZ 20-5-12.5 MG TABS Take by mouth.    . ALPRAZolam (XANAX) 1 MG tablet 1/2-1 tablet prn for nerves, up to TID (Patient not taking: Reported on 10/25/2017) 20 tablet 0  . amLODipine (NORVASC) 10 MG tablet TAKE 1 TABLET (10 MG TOTAL) BY MOUTH DAILY. 30 tablet 5  . amLODIPine-Valsartan-HCTZ 10-320-25 MG TABS Take by mouth.    Marland Kitchen aspirin EC 81 MG tablet Take 1 tablet (81 mg total) by mouth daily. (Patient not taking: Reported on 10/25/2017) 30 tablet 11  . atorvastatin (LIPITOR) 20 MG tablet Take 1 tablet (20 mg total) by mouth daily. (Patient not taking: Reported  on 10/25/2017) 90 tablet 1  . benzonatate (TESSALON) 100 MG capsule     . cetirizine (ZYRTEC) 10 MG tablet Take 10 mg by mouth daily.    Marland Kitchen gabapentin (NEURONTIN) 100 MG capsule TAKE 1 CAPSULE (100 MG TOTAL) BY MOUTH 2 (TWO) TIMES DAILY. (Patient not taking: Reported on 10/25/2017) 60 capsule 0  . PROAIR RESPICLICK 831 (90 Base) MCG/ACT AEPB     . spironolactone (ALDACTONE) 25 MG tablet Take 1 tablet (25 mg total) by mouth daily. 30 tablet 5  . valsartan-hydrochlorothiazide (DIOVAN-HCT) 320-25 MG per tablet Take 1 tablet by mouth daily. 30 tablet 5    ROS: As per HPI with comprehensive ROS otherwise negative.   Physical Examination: Blood pressure (!) 145/87, pulse 68, temperature 98.2 F (36.8 C), temperature source Oral, resp. rate 16, height 5\' 2"  (1.575 m), weight 99.8 kg, last menstrual period 10/22/2018, SpO2 99 %.  HEENT-  [place]/AT  Cardiovascular - RRR Lungs - CTA bilaterally  Abdomen - Obese, nondistended Extremities - No edema  Neurologic Examination: Mental Status:  Awake and alert. Fully oriented. Speech fluent with intact naming and comprehension. No dysarthria.  Cranial Nerves: II:  Visual fields intact with no extinction to DSS. PERRL 3 >> 2 mm. III,IV, VI: EOMI without nystagmus. No ptosis.  V,VII: No facial droop. Temp sensation equal bilaterally  VIII: hearing intact to voice IX,X: No hypophonia XI: Symmetric XII: midline tongue extension  Motor: Right :  Upper extremity   5/5    Left:     Upper extremity   5/5  Lower extremity   5/5     Lower extremity   5/5 Tone and bulk:normal tone throughout; no atrophy noted Sensory: Temp and light touch intact throughout, bilaterally. No extinction. No asymmetry.  Deep Tendon Reflexes:  2+ bilateral brachioradialis, biceps and achilles. 3+ bilateral patellae.  Plantars: Right: downgoing   Left: downgoing Cerebellar: No ataxia with FNF bilaterally. Equivocal for mild ataxia with H-S bilaterally  Gait: Deferred  Results for  orders placed or performed during the hospital encounter of 11/22/18 (from the past 48 hour(s))  CBG monitoring, ED     Status: None   Collection Time: 11/22/18  7:25 PM  Result Value Ref Range   Glucose-Capillary 77 70 - 99 mg/dL  Ethanol     Status: None   Collection Time: 11/22/18  8:20 PM  Result Value Ref Range   Alcohol, Ethyl (B) <10 <10 mg/dL    Comment:        LOWEST DETECTABLE LIMIT FOR SERUM ALCOHOL IS 10 mg/dL FOR MEDICAL PURPOSES ONLY (NOTE) Lowest detectable limit for serum alcohol is 10 mg/dL. For medical purposes only. Performed at [facility], [address] [place], [place] [address]   Protime-INR     Status: None   Collection Time: 11/22/18  8:20 PM  Result Value Ref Range   Prothrombin Time 14.5 11.4 - 15.2 seconds   INR 1.1 0.8 - 1.2    Comment: (NOTE) INR goal varies based on device and disease states. Performed at [facility], [address] [place], [place] [address]   APTT     Status: None   Collection Time: 11/22/18  8:20 PM  Result Value Ref Range   aPTT 32 24 - 36 seconds    Comment: Performed at [facility], [address] [place], [place] [address]  CBC     Status: None   Collection Time: 11/22/18  8:20 PM  Result Value Ref Range   WBC 6.2 4.0 - 10.5 K/uL   RBC 4.79 3.87 - 5.11 MIL/uL   Hemoglobin 13.7 12.0 - 15.0 g/dL   HCT 69.6 29.5 - 28.4 %   MCV 87.7 80.0 - 100.0 fL   MCH 28.6 26.0 - 34.0 pg   MCHC 32.6 30.0 - 36.0 g/dL   RDW 13.2 44.0 - 10.2 %   Platelets 178 150 - 400 K/uL   nRBC 0.0 0.0 - 0.2 %    Comment: Performed at [facility], [address] [place], [place] [address]  Differential     Status: None   Collection Time: 11/22/18  8:20 PM  Result Value Ref Range   Neutrophils Relative % 48 %   Neutro Abs 3.0 1.7 - 7.7 K/uL   Lymphocytes Relative 39 %   Lymphs Abs 2.4 0.7 - 4.0 K/uL   Monocytes Relative 7 %   Monocytes Absolute 0.5 0.1 - 1.0 K/uL   Eosinophils Relative 5 %    Eosinophils Absolute 0.3 0.0 - 0.5 K/uL   Basophils Relative 1 %   Basophils Absolute 0.1 0.0 - 0.1 K/uL   Immature Granulocytes 0 %   Abs Immature Granulocytes 0.01 0.00 - 0.07 K/uL    Comment: Performed at [facility], [address] [place], [place] [address]  Comprehensive metabolic panel     Status: Abnormal   Collection  Time: 11/22/18  8:20 PM  Result Value Ref Range   Sodium 137 135 - 145 mmol/L   Potassium 3.6 3.5 - 5.1 mmol/L   Chloride 99 98 - 111 mmol/L   CO2 27 22 - 32 mmol/L   Glucose, Bld 94 70 - 99 mg/dL   BUN 16 6 - 20 mg/dL   Creatinine, Ser 1.610.88 0.44 - 1.00 mg/dL   Calcium 9.6 8.9 - 09.610.3 mg/dL   Total Protein 8.4 (H) 6.5 - 8.1 g/dL   Albumin 4.0 3.5 - 5.0 g/dL   AST 15 15 - 41 U/L   ALT 14 0 - 44 U/L   Alkaline Phosphatase 68 38 - 126 U/L   Total Bilirubin 0.4 0.3 - 1.2 mg/dL   GFR calc non Af Amer >60 >60 mL/min   GFR calc Af Amer >60 >60 mL/min   Anion gap 11 5 - 15    Comment: Performed at [facility] [place], [address] [place], [place] [address]  Pregnancy, urine     Status: None   Collection Time: 11/22/18  8:22 PM  Result Value Ref Range   Preg Test, Ur NEGATIVE NEGATIVE    Comment:        THE SENSITIVITY OF THIS METHODOLOGY IS >20 mIU/mL. Performed at [facility] [place], [address] [place], [place] [address]   SARS Coronavirus 2 (CEPHEID - Performed in [facility] lab), Hosp Order     Status: None   Collection Time: 11/22/18  8:22 PM   Specimen: Nasopharyngeal  Result Value Ref Range   SARS Coronavirus 2 NEGATIVE NEGATIVE    Comment: (NOTE) If result is NEGATIVE SARS-CoV-2 target nucleic acids are NOT DETECTED. The SARS-CoV-2 RNA is generally detectable in upper and lower  respiratory specimens during the acute phase of infection. The lowest  concentration of SARS-CoV-2 viral copies this assay can detect is 250  copies / mL. A negative result does not preclude SARS-CoV-2 infection  and should not be  used as the sole basis for treatment or other  patient management decisions.  A negative result may occur with  improper specimen collection / handling, submission of specimen other  than nasopharyngeal swab, presence of viral mutation(s) within the  areas targeted by this assay, and inadequate number of viral copies  (<250 copies / mL). A negative result must be combined with clinical  observations, patient history, and epidemiological information. If result is POSITIVE SARS-CoV-2 target nucleic acids are DETECTED. The SARS-CoV-2 RNA is generally detectable in upper and lower  respiratory specimens dur ing the acute phase of infection.  Positive  results are indicative of active infection with SARS-CoV-2.  Clinical  correlation with patient history and other diagnostic information is  necessary to determine patient infection status.  Positive results do  not rule out bacterial infection or co-infection with other viruses. If result is PRESUMPTIVE POSTIVE SARS-CoV-2 nucleic acids MAY BE PRESENT.   A presumptive positive result was obtained on the submitted specimen  and confirmed on repeat testing.  While 2019 novel coronavirus  (SARS-CoV-2) nucleic acids may be present in the submitted sample  additional confirmatory testing may be necessary for epidemiological  and / or clinical management purposes  to differentiate between  SARS-CoV-2 and other Sarbecovirus currently known to infect humans.  If clinically indicated additional testing with an alternate test  methodology [phone]) is advised. The SARS-CoV-2 RNA is generally  detectable in upper and lower respiratory sp ecimens during the  acute  phase of infection. The expected result is Negative. Fact Sheet for Patients:  BoilerBrush.com.cyhttps://www.fda.gov/media/136312/download Fact Sheet for Healthcare Providers: https://pope.com/https://www.fda.gov/media/136313/download This test is not yet approved or cleared by the [place] FDA and has been authorized  for detection and/or diagnosis of SARS-CoV-2 by FDA under an Emergency Use Authorization (EUA).  This EUA will remain in effect (meaning this test can be used) for the duration of the COVID-19 declaration under Section 564(b)(1) of the Act, 21 U.S.C. section 360bbb-3(b)(1), unless the authorization is terminated or revoked sooner. Performed at [facility] [place], [address] [place], [place] [address]   Urine rapid drug screen (hosp performed)     Status: None   Collection Time: 11/23/18 12:18 AM  Result Value Ref Range   Opiates NONE DETECTED NONE DETECTED   Cocaine NONE DETECTED NONE DETECTED   Benzodiazepines NONE DETECTED NONE DETECTED   Amphetamines NONE DETECTED NONE DETECTED   Tetrahydrocannabinol NONE DETECTED NONE DETECTED   Barbiturates NONE DETECTED NONE DETECTED    Comment: (NOTE) DRUG SCREEN FOR MEDICAL PURPOSES ONLY.  IF CONFIRMATION IS NEEDED FOR ANY PURPOSE, NOTIFY LAB WITHIN 5 DAYS. LOWEST DETECTABLE LIMITS FOR URINE DRUG SCREEN Drug Class                     Cutoff (ng/mL) Amphetamine and metabolites    1000 Barbiturate and metabolites    200 Benzodiazepine                 200 Tricyclics and metabolites     300 Opiates and metabolites        300 Cocaine and metabolites        300 THC                            50 Performed at [facility] [place], [address] [place], [place] [address]   Urinalysis, Routine w reflex microscopic     Status: Abnormal   Collection Time: 11/23/18 12:18 AM  Result Value Ref Range   Color, Urine YELLOW YELLOW   APPearance CLEAR CLEAR   Specific Gravity, Urine >1.030 (H) 1.005 - 1.030   pH 5.5 5.0 - 8.0   Glucose, UA NEGATIVE NEGATIVE mg/dL   Hgb urine dipstick MODERATE (A) NEGATIVE   Bilirubin Urine NEGATIVE NEGATIVE   Ketones, ur NEGATIVE NEGATIVE mg/dL   Protein, ur NEGATIVE NEGATIVE mg/dL   Nitrite NEGATIVE NEGATIVE   Leukocytes,Ua NEGATIVE NEGATIVE    Comment: Performed at [facility] [place], [address] [place], [place] [address]  Urinalysis, Microscopic (reflex)     Status: Abnormal   Collection Time: 11/23/18 12:18 AM  Result Value Ref Range   RBC / HPF 0-5 0 - 5 RBC/hpf   WBC, UA 0-5 0 - 5 WBC/hpf   Bacteria, UA FEW (A) NONE SEEN   Squamous Epithelial / LPF 0-5 0 - 5   Mucus PRESENT     Comment: Performed at [facility] [place], [address] [place], [place] [address]   Ct Head Wo Contrast  Result Date: 11/22/2018 CLINICAL DATA:  Confusion 3 days. Hypertension. History of previous stroke. EXAM: CT HEAD WITHOUT CONTRAST TECHNIQUE: Contiguous axial images were obtained from the base of the skull through the vertex without intravenous contrast. COMPARISON:  CT 08/25/2011 and MRI brain 08/25/2011 FINDINGS: Brain: The ventricles and cisterns are within normal. CSF spaces are normal. There is  a focus of acute hemorrhage projecting over the region of the left thalamus/basal ganglia measuring 2.5 x 1.8 cm in AP and transverse dimension. There is adjacent edema. Findings are compatible with hypertensive stroke with acute hemorrhage. Similar findings present on the right side on the previous study from 2013. No intraventricular hemorrhage. Minimal local mass effect. No significant midline shift. Old lacunar infarct over the right thalamus. Vascular: No hyperdense vessel or unexpected calcification. Skull: Normal. Negative for fracture or focal lesion. Sinuses/Orbits: No acute finding. Other: None. IMPRESSION: Acute hemorrhage over the left thalamus/basal ganglia measuring 1.8 x 2.5 cm with adjacent edema and minimal local mass effect compatible with hypertensive bleed. No midline shift. Critical Value/emergent results were called by telephone at the time of interpretation on 11/22/2018 at 9:38 pm to Dr. Alvira MondayERIN SCHLOSSMAN , who verbally acknowledged these results. Electronically Signed   By: Elberta Fortisaniel  Boyle M.D.   On: 11/22/2018 21:38     Assessment: 48 year old female with acute left  thalamic hemorrhage 1. DDx includes hypertensive bleed based on location versus underlying lesion given multifocal hemorrhagic hyperdensities seen in the thalamus on CT. 2. Exam reveals no sensory or motor deficit. Language intact. The negative exam is unusual given the size of the left thalamic hemorrhage.   Plan: 1. Admit to ICU under Neurology service 2. MRI brain with contrast, MRA of head 3. Carotid ultrasound 4. TTE 5. PT consult, OT consult, Speech consult 6. Cardiac telemetry 7. Frequent neuro checks 8. Hold off on hypertonic saline for now.   9. Repeat CT head in 24 hours 10. BP management with clevidipine drip  11. No antiplatelet medications or anticoagulants. DVT prophylaxis with SCDs   50 minutes spent in the evaluation and management of this critically ill patient. Critical illness criteria include risk of rapid deterioration should her hemorrhage increase in size or if mass effect results in hydrocephalus.   Electronically signed: Dr. Caryl PinaEric Hill Mackie 11/23/2018, 1:05 AM

## 2018-11-24 DIAGNOSIS — I160 Hypertensive urgency: Secondary | ICD-10-CM | POA: Diagnosis not present

## 2018-11-24 DIAGNOSIS — Z7982 Long term (current) use of aspirin: Secondary | ICD-10-CM | POA: Diagnosis not present

## 2018-11-24 DIAGNOSIS — I610 Nontraumatic intracerebral hemorrhage in hemisphere, subcortical: Secondary | ICD-10-CM | POA: Diagnosis not present

## 2018-11-24 DIAGNOSIS — Z6841 Body Mass Index (BMI) 40.0 and over, adult: Secondary | ICD-10-CM | POA: Diagnosis not present

## 2018-11-24 DIAGNOSIS — Z20828 Contact with and (suspected) exposure to other viral communicable diseases: Secondary | ICD-10-CM | POA: Diagnosis not present

## 2018-11-24 DIAGNOSIS — I10 Essential (primary) hypertension: Secondary | ICD-10-CM | POA: Diagnosis not present

## 2018-11-24 DIAGNOSIS — Z8673 Personal history of transient ischemic attack (TIA), and cerebral infarction without residual deficits: Secondary | ICD-10-CM | POA: Diagnosis not present

## 2018-11-24 DIAGNOSIS — G936 Cerebral edema: Secondary | ICD-10-CM

## 2018-11-24 DIAGNOSIS — E785 Hyperlipidemia, unspecified: Secondary | ICD-10-CM | POA: Diagnosis not present

## 2018-11-24 MED ORDER — HYDRALAZINE HCL 20 MG/ML IJ SOLN: 20.0000 mg | Freq: Three times a day (TID) | INTRAMUSCULAR | Status: DC | PRN

## 2018-11-24 NOTE — Progress Notes (Signed)
STROKE TEAM PROGRESS NOTE   SUBJECTIVE (INTERVAL HISTORY) Patient is sitting up in a bedside chair.  She is pleasant awake alert cooperative.  Blood pressure adequately controlled.  She has no complaints today.  Transthoracic echo yesterday showed normal ejection fraction.  Carotid ultrasound showed no significant extracranial stenosis..  Patient is complaining of some cognitive difficulties and trouble processing information and is awaiting speech therapy evaluation for cognition OBJECTIVE Vitals:   11/24/18 0500 11/24/18 0600 11/24/18 0700 11/24/18 0800  BP: 130/73 135/87 117/78 (!) 133/96  Pulse: 74 78 70 87  Resp: 17 18 14 19   Temp:    98.2 F (36.8 C)  TempSrc:    Oral  SpO2: 98% 97% 97% 99%  Weight:      Height:        CBC:  Recent Labs  Lab 11/22/18 2020  WBC 6.2  NEUTROABS 3.0  HGB 13.7  HCT 42.0  MCV 87.7  PLT 694    Basic Metabolic Panel:  Recent Labs  Lab 11/22/18 2020  NA 137  K 3.6  CL 99  CO2 27  GLUCOSE 94  BUN 16  CREATININE 0.88  CALCIUM 9.6    Lipid Panel: No results found for: CHOL, TRIG, HDL, CHOLHDL, VLDL, LDLCALC HgbA1c:  Lab Results  Component Value Date   HGBA1C 5.9 (H) 02/18/2014   Urine Drug Screen:     Component Value Date/Time   LABOPIA NONE DETECTED 11/23/2018 0018   COCAINSCRNUR NONE DETECTED 11/23/2018 0018   LABBENZ NONE DETECTED 11/23/2018 0018   AMPHETMU NONE DETECTED 11/23/2018 0018   THCU NONE DETECTED 11/23/2018 0018   LABBARB NONE DETECTED 11/23/2018 0018    Alcohol Level     Component Value Date/Time   ETH <10 11/22/2018 2020    IMAGING Ct Head Wo Contrast 11/22/2018 Acute hemorrhage over the left thalamus/basal ganglia measuring 1.8 x 2.5 cm with adjacent edema and minimal local mass effect compatible with hypertensive bleed. No midline shift.   MRI HEAD  11/23/2018 1. 16 x 11 x 12 mm acute intraparenchymal hemorrhage centered at the left thalamus, relatively stable from previous. Associated regional mass  effect and edema with up to 4 mm of localized left-to-right midline shift. No hydrocephalus or ventricular trapping. No intraventricular extension of hemorrhage. Finding favored to be hypertensive in nature.  2. Multiple additional chronic micro hemorrhages centered about the deep gray nuclei, brainstem, and cerebellum, most characteristic for poorly controlled hypertension.  3. Underlying chronic microvascular ischemic disease with scattered remote lacunar infarcts as above.   MRA HEAD 11/23/2018 1. Technically limited exam due to motion artifact.  2. Grossly negative intracranial MRA. No large vessel occlusion or hemodynamically significant stenosis. No vascular abnormality seen underlying the left thalamic hemorrhage.   Transthoracic Echocardiogram  11/23/2018  1. The left ventricle has normal systolic function with an ejection fraction of 60-65%. The cavity size was normal. Left ventricular diastolic parameters were normal.  2. The right ventricle has normal systolic function. The cavity was normal. There is no increase in right ventricular wall thickness.  3. Mild thickening of the mitral valve leaflet.  4. The aortic valve is tricuspid. Mild thickening of the aortic valve.  5. The aortic root is normal in size and structure.  6. The interatrial septum was not well visualized.  Bilateral Carotid Dopplers  11/23/2018 Right Carotid: The extracranial vessels were near-normal with only minimal wall thickening or plaque. No significant change since prior study done 08/27/2011. Left Carotid: The extracranial vessels were near-normal  with only minimal wall thickening or plaque. No significant change since prior study done 08/27/2011. Vertebrals:  Bilateral vertebral arteries demonstrate antegrade flow.  Subclavians: Normal flow hemodynamics were seen in bilateral subclavian arteries.  EKG - SR rate 68 BPM. Possible old anterior MI. (See cardiology reading for complete details)   PHYSICAL EXAM   Blood pressure (!) 133/96, pulse 87, temperature 98.2 F (36.8 C), temperature source Oral, resp. rate 19, height 5\' 2"  (1.575 m), weight 107.6 kg, last menstrual period 10/22/2018, SpO2 99 %. Pleasant middle-aged African-American lady not in distress. . Afebrile. Head is nontraumatic. Neck is supple without bruit.    Cardiac exam no murmur or gallop. Lungs are clear to auscultation. Distal pulses are well felt.  Neurological Exam :  Awake  Alert oriented x 3.  Slightly nonfluent speech but no paraphasic errors or word finding difficulties.Marland Kitchen.eye movements full without nystagmus.fundi were not visualized. Vision acuity and fields appear normal. Hearing is normal. Palatal movements are normal. Face symmetric. Tongue midline. Normal strength, tone, reflexes and coordination. Normal sensation. Gait deferred.   ASSESSMENT/PLAN Ms. Bary Lerichehelma Bryner is a 48 y.o. female with history of HTN, obesity and prior hemorrhagic stroke in 2013 who [facility] feeling "woozy" with memory changes, word finding difficulty and fatigue. She was started on antihypertensived and did not receive IV t-PA due to CT showing hemorrhage.  Stroke: acute left thalamus/basal ganglia  hemorrhage - likely due to htn.  CT head - Acute hemorrhage over the left thalamus/basal ganglia measuring 1.8 x 2.5 cm with adjacent edema and minimal local mass effect compatible with hypertensive bleed.  MRI head - 16 x 11 x 12 mm acute intraparenchymal hemorrhage centered at the left thalamus - 4 mm of localized left-to-right midline shift - multiple additional chronic micro hemorrhages - scattered remote lacunar infarcts.  MRA head - grossly normal  Carotid Doppler - B ICA 1-39% stenosis, VAs antegrade   2D Echo - EF 60-65%. No source of embolus    [employer] CorporationSars Corona Virus 2  - negative  LDL - pending   HgbA1c  - pending   UDS  - negative  VTE prophylaxis - SCDs  No antithrombotic prior to admission, now on No  antithrombotic  Therapy recommendations:  OP PT and OT  Disposition:  pending   Transfer to the floor  Hypertension  Stable  Initially treated with labetolol and cleviprex, now off  SBP goal < 160 mm Hg . Long-term BP goal normotensive  Other Stroke Risk Factors  ETOH use, advised to drink no more than 1 alcoholic beverage per day.  Morbid Obesity, Body mass index is 43.39 kg/m., recommend weight loss, diet and exercise as appropriate   Hx Stroke  08/2011 - Prior R thalamic hemorrhage d/t HTN. Previous lacunar infarcts by imaging.  Coronary artery disease - Possible old anterior Mi by ECG  Other Active Problems  Pt reported cognitive decline - have asked SLP to assess  Will keep 1 more day for further cognitive assessment  Hospital day # 2  I have personally obtained history,examined this patient, reviewed notes, independently viewed imaging studies, participated in medical decision making and plan of care.ROS completed by me personally and pertinent positives fully documented  I have made any additions or clarifications directly to the above note. Recommend speech therapy evaluation for cognition.  Mobilize out of bed.  Physical occupational therapy consult.  Transfer to neurology floor bed today and likely discharge home in next 1 to 2 days.  Discussed with  patient and answered questions.  Greater than 50% time during this 35-minute visit was spent in counseling and coordination of care about her intracerebral hemorrhage and discussion about blood pressure management and cognitive difficulties and answering questions. Delia HeadyPramod , MD Medical Director [facility] Pager: [phone] 11/24/2018 2:23 PM   Delia HeadyPramod , MD Medical Director [facility] Pager: [phone] 11/24/2018 9:42 AM   To contact Stroke Continuity provider, please refer to [email or web address]. After hours, contact General Neurology

## 2018-11-24 NOTE — Evaluation (Signed)
Speech Language Pathology Evaluation Patient Details Name: Kristy Vasquez MRN: [id number] DOB: October 24, 1970 Today's Date: 11/24/2018 Time: 1325-1400 SLP Time Calculation (min) (ACUTE ONLY): 35 min  Problem List:  Patient Active Problem List   Diagnosis Date Noted  . ICH (intracerebral hemorrhage) ([place]) 11/23/2018  . Cytotoxic brain edema ([place]) 11/23/2018  . Intraparenchymal hemorrhage of brain ([place]) 11/22/2018  . Cerebral hemorrhage ([place]) 08/30/2011  . Accelerated hypertension 08/30/2011  . Obesity, Class III, BMI 40-49.9 (morbid obesity) ([place]) 08/30/2011   Past Medical History:  Past Medical History:  Diagnosis Date  . Hypertension 2013  . Obesity   . Stroke [facility]) 2013   hemorrhagic stroke   Past Surgical History:  Past Surgical History:  Procedure Laterality Date  . NOVASURE ABLATION    . TUBAL LIGATION     HPI:  48 y.o. female with HTN, obesity and prior hemorrhagic stroke in 2013 who presented to [facility] on Saturday evening with feeling "woozy" and memory changes as well as word finding difficulty and fatigue since Tuesday. CT at the [facility] ED revealed an acute hemorrhage over the left thalamus/basal ganglia. SLP cognitive evaluation in 2013 showed functional communication but mild deficits in complex problem solving and awareness.   Assessment / Plan / Recommendation Clinical Impression  Pt has significant impairments in sustained attention and storage/retrieval of new information, initially showing no intellectual awareness of any deficits, but acknowledging after completion of testing, including the [facility] with a score of 10/30, that she is not back to her baseline. She still has limited anticipatory awareness, thinking that she will be able to go home and return to normal daily activities both at home and at work. Mod cues were provided for verbal problem solving related to safety concerns. Recommend OP SLP f/u and 24/7 supervision for safety. Will continue to follow acutely.     SLP Assessment  SLP Recommendation/Assessment: Patient needs continued Speech Lanaguage Pathology Services SLP Visit Diagnosis: Cognitive communication deficit (R41.841)    Follow Up Recommendations  Outpatient SLP;24 hour supervision/assistance    Frequency and Duration min 2x/week  2 weeks      SLP Evaluation Cognition  Overall Cognitive Status: Impaired/Different from baseline Arousal/Alertness: Awake/alert Orientation Level: Oriented X4 Attention: Sustained Sustained Attention: Impaired Sustained Attention Impairment: Verbal basic Memory: Impaired Memory Impairment: Storage deficit;Retrieval deficit;Decreased recall of new information Awareness: Impaired Awareness Impairment: Intellectual impairment;Anticipatory impairment Problem Solving: Impaired Problem Solving Impairment: Verbal basic;Verbal complex Safety/Judgment: Impaired       Comprehension  Auditory Comprehension Overall Auditory Comprehension: Impaired Commands: Impaired Complex Commands: 50-74% accurate    Expression Expression Primary Mode of Expression: Verbal Verbal Expression Overall Verbal Expression: Appears within functional limits for tasks assessed   Oral / Motor  Motor Speech Overall Motor Speech: Appears within functional limits for tasks assessed   GO                    Kristy Vasquez 11/24/2018, 2:08 PM  Pollyann Glen, M.A. [place] Acute Environmental education officer [phone] Office [phone]
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
## 2018-11-24 NOTE — Progress Notes (Signed)
Physical Therapy Treatment Patient Details Name: Kristy Vasquez MRN: [id number] DOB: Aug 22, 1970 Today's Date: 11/24/2018    History of Present Illness 48 y.o. female with HTN, obesity and prior hemorrhagic stroke in 2013 who [facility] with feeling "woozy" and memory changes as well as word finding difficulty and fatigue since Tuesday. [facility] revealed an acute hemorrhage over the left thalamus/basal ganglia.    PT Comments    Patient progressing well towards PT goals. Tolerated stair training with supervision for safety using rail for support. Able to perform higher level balance activities- head turns, changes in direction, walking backwards etc without overt LOB or difficulty. Continues to demonstrate slow gait speed. Pt seems to have cognitive deficits- impaired attention and awareness reporting she is fine except for her speech. Not forthcoming with information. Pt's room pitch black upon entering and pt reports no sensitivity to light or headache/pain or any explanation why she is in darkness in bed at noon. Encouraged OOB to chair but pt wanting to return to bed. Will follow.   Follow Up Recommendations  Outpatient PT;Supervision/Assistance - 24 hour     Equipment Recommendations  None recommended by PT    Recommendations for Other Services       Precautions / Restrictions Precautions Precautions: Fall Restrictions Weight Bearing Restrictions: No    Mobility  Bed Mobility Overal bed mobility: Modified Independent Bed Mobility: Supine to Sit;Sit to Supine     Supine to sit: Modified independent (Device/Increase time);HOB elevated Sit to supine: Modified independent (Device/Increase time);HOB elevated   General bed mobility comments: No assist needed. No dizziness.  Transfers Overall transfer level: Modified independent Equipment used: None Transfers: Sit to/from Stand           General transfer comment: Stood from EOB without  difficulty. Declined transfer to chair.  Ambulation/Gait Ambulation/Gait assistance: Supervision Gait Distance (Feet): 400 Feet Assistive device: None Gait Pattern/deviations: Step-through pattern;Decreased stride length Gait velocity: decreased   General Gait Details: Slow speed; able to perform balance challenges without LOB. VSS.   Stairs Stairs: Yes Stairs assistance: Supervision Stair Management: One rail Left;Alternating pattern;Step to pattern Number of Stairs: 13 General stair comments: Cues for safety. RR up to 40s.   Wheelchair Mobility    Modified Rankin (Stroke Patients Only) Modified Rankin (Stroke Patients Only) Pre-Morbid Rankin Score: No symptoms Modified Rankin: Moderately severe disability     Balance Overall balance assessment: Needs assistance Sitting-balance support: Feet supported;No upper extremity supported Sitting balance-Leahy Scale: Good     Standing balance support: During functional activity Standing balance-Leahy Scale: Good               High level balance activites: Backward walking;Direction changes;Turns;Sudden stops;Head turns High Level Balance Comments: Tolerated above activities without changes in gait or overt LOB. Able to step over objects without difficulty. Standardized Balance Assessment Standardized Balance Assessment : Dynamic Gait Index   Dynamic Gait Index Level Surface: Mild Impairment Gait with Horizontal Head Turns: Normal Gait with Vertical Head Turns: Normal Gait and Pivot Turn: Normal Step Over Obstacle: Normal Step Around Obstacles: Normal Steps: Mild Impairment      Cognition Arousal/Alertness: Awake/alert Behavior During Therapy: Flat affect Overall Cognitive Status: Impaired/Different from baseline Area of Impairment: Memory;Awareness                   Current Attention Level: Sustained Memory: Decreased short-term memory Following Commands: Follows multi-step commands with increased  time Safety/Judgement: Decreased awareness of deficits;Decreased  awareness of safety Awareness: Emergent Problem Solving: Slow processing;Difficulty sequencing General Comments: Reports feeling fine except for speech.      Exercises      General Comments General comments (skin integrity, edema, etc.): VSS.      Pertinent Vitals/Pain Pain Assessment: No/denies pain    Home Living                      Prior Function            PT Goals (current goals can now be found in the care plan section) Acute Rehab PT Goals Patient Stated Goal: "Go home to my daughter and granddaughter" Progress towards PT goals: Progressing toward goals    Frequency    Min 4X/week      PT Plan Current plan remains appropriate    Co-evaluation              AM-PAC PT "6 Clicks" Mobility   Outcome Measure  Help needed turning from your back to your side while in a flat bed without using bedrails?: None Help needed moving from lying on your back to sitting on the side of a flat bed without using bedrails?: None Help needed moving to and from a bed to a chair (including a wheelchair)?: None Help needed standing up from a chair using your arms (e.g., wheelchair or bedside chair)?: None Help needed to walk in hospital room?: None Help needed climbing 3-5 steps with a railing? : A Little 6 Click Score: 23    End of Session Equipment Utilized During Treatment: Gait belt Activity Tolerance: Patient tolerated treatment well Patient left: in bed;with call bell/phone within reach Nurse Communication: Mobility status PT Visit Diagnosis: Other symptoms and signs involving the nervous system (R29.898)     Time: 5784-6962 PT Time Calculation (min) (ACUTE ONLY): 15 min  Charges:  $Gait Training: 8-22 mins                     Wray Kearns, PT, DPT Acute Rehabilitation Services Pager [phone] Office [place] 11/24/2018, 12:42 PM

## 2018-11-24 NOTE — Progress Notes (Signed)
Patient arrived to unit. A&O x4. Skin intact. Pt states no pain. Patient purse in closet. Silver bracelet on right wrist. Bed in lowest position. Call bell within reach. Nurse will continue to monitor. [place]

## 2018-11-25 DIAGNOSIS — I619 Nontraumatic intracerebral hemorrhage, unspecified: Secondary | ICD-10-CM

## 2018-11-25 DIAGNOSIS — E785 Hyperlipidemia, unspecified: Secondary | ICD-10-CM | POA: Diagnosis present

## 2018-11-25 LAB — LIPID PANEL
Cholesterol: 190 mg/dL (ref 0–200)
HDL: 34 mg/dL — ABNORMAL LOW (ref >40)
LDL Cholesterol: 137 mg/dL — ABNORMAL HIGH (ref 0–99)
Total CHOL/HDL Ratio: 5.6 RATIO
Triglycerides: 94 mg/dL (ref <150)
VLDL: 19 mg/dL (ref 0–40)

## 2018-11-25 LAB — HEMOGLOBIN A1C
Hgb A1c MFr Bld: 6 % — ABNORMAL HIGH (ref 4.8–5.6)
Mean Plasma Glucose: 125.5 mg/dL

## 2018-11-25 MED ORDER — SPIRONOLACTONE 25 MG PO TABS: 25.0000 mg | ORAL_TABLET | Freq: Every day | ORAL | 2 refills | Status: AC

## 2018-11-25 MED ORDER — ATORVASTATIN CALCIUM 40 MG PO TABS: 40.0000 mg | ORAL_TABLET | Freq: Every day | ORAL | 2 refills | Status: AC

## 2018-11-25 MED ORDER — ATORVASTATIN CALCIUM 40 MG PO TABS: 40.0000 mg | ORAL_TABLET | Freq: Every day | ORAL | Status: DC

## 2018-11-25 NOTE — Discharge Summary (Addendum)
Stroke Discharge Summary  Patient ID: Kristy Vasquez   MRN: [id number]      DOB: 02-11-1971  Date of Admission: 11/22/2018 Date of Discharge: 11/25/2018  Attending Physician:  Garvin Fila, MD, Stroke MD Consultant(s):    None  Patient's PCP:  Carlena Hurl, PA-C  DISCHARGE DIAGNOSIS:  Principal Problem:   Cerebral hemorrhage ([place]) - hypertensive L thalamic/basal ganglia ICH Active Problems:   Accelerated hypertension   Obesity, Class III, BMI 40-49.9 (morbid obesity) ([place])   Hyperlipemia   Past Medical History:  Diagnosis Date  . Hypertension 2013  . Obesity   . Stroke [facility]) 2013   hemorrhagic stroke   Past Surgical History:  Procedure Laterality Date  . NOVASURE ABLATION    . TUBAL LIGATION      Allergies as of 11/25/2018   No Known Allergies     Medication List    TAKE these medications   atorvastatin 40 MG tablet Commonly known as: LIPITOR Take 1 tablet (40 mg total) by mouth daily at 6 PM.   Norel AD 4-10-325 MG Tabs Generic drug: Chlorphen-PE-Acetaminophen Take 1 tablet by mouth every 6 (six) hours as needed (for allergies).   Olmesartan-amLODIPine-HCTZ 40-10-25 MG Tabs Take 1 tablet by mouth daily.   spironolactone 25 MG tablet Commonly known as: ALDACTONE Take 1 tablet (25 mg total) by mouth daily. Start taking on: November 26, 2018   Vitamin D (Ergocalciferol) 1.25 MG (50000 UT) Caps capsule Commonly known as: DRISDOL Take 50,000 Units by mouth every Sunday.       LABORATORY STUDIES CBC    Component Value Date/Time   WBC 6.2 11/22/2018 2020   RBC 4.79 11/22/2018 2020   HGB 13.7 11/22/2018 2020   HCT 42.0 11/22/2018 2020   PLT 178 11/22/2018 2020   MCV 87.7 11/22/2018 2020   MCH 28.6 11/22/2018 2020   MCHC 32.6 11/22/2018 2020   RDW 13.7 11/22/2018 2020   LYMPHSABS 2.4 11/22/2018 2020   MONOABS 0.5 11/22/2018 2020   EOSABS 0.3 11/22/2018 2020   BASOSABS 0.1 11/22/2018 2020   CMP    Component Value Date/Time   NA 137  11/22/2018 2020   K 3.6 11/22/2018 2020   CL 99 11/22/2018 2020   CO2 27 11/22/2018 2020   GLUCOSE 94 11/22/2018 2020   BUN 16 11/22/2018 2020   CREATININE 0.88 11/22/2018 2020   CREATININE 0.85 02/18/2014 0954   CALCIUM 9.6 11/22/2018 2020   PROT 8.4 (H) 11/22/2018 2020   ALBUMIN 4.0 11/22/2018 2020   AST 15 11/22/2018 2020   ALT 14 11/22/2018 2020   ALKPHOS 68 11/22/2018 2020   BILITOT 0.4 11/22/2018 2020   GFRNONAA >60 11/22/2018 2020   GFRAA >60 11/22/2018 2020   COAGS Lab Results  Component Value Date   INR 1.1 11/22/2018   INR 1.14 08/25/2011   Lipid Panel    Component Value Date/Time   CHOL 190 11/25/2018 0425   TRIG 94 11/25/2018 0425   HDL 34 (L) 11/25/2018 0425   CHOLHDL 5.6 11/25/2018 0425   VLDL 19 11/25/2018 0425   LDLCALC 137 (H) 11/25/2018 0425   HgbA1C  Lab Results  Component Value Date   HGBA1C 6.0 (H) 11/25/2018   Urine Drug Screen     Component Value Date/Time   LABOPIA NONE DETECTED 11/23/2018 0018   COCAINSCRNUR NONE DETECTED 11/23/2018 0018   LABBENZ NONE DETECTED 11/23/2018 0018   AMPHETMU NONE DETECTED 11/23/2018 0018   THCU NONE DETECTED 11/23/2018  0018   LABBARB NONE DETECTED 11/23/2018 0018    Alcohol Level    Component Value Date/Time   ETH <10 11/22/2018 2020     SIGNIFICANT DIAGNOSTIC STUDIES Ct Head Wo Contrast 11/22/2018 Acute hemorrhage over the left thalamus/basal ganglia measuring 1.8 x 2.5 cm with adjacent edema and minimal local mass effect compatible with hypertensive bleed. No midline shift.   MRI HEAD  11/23/2018 1. 16 x 11 x 12 mm acute intraparenchymal hemorrhage centered at the left thalamus, relatively stable from previous. Associated regional mass effect and edema with up to 4 mm of localized left-to-right midline shift. No hydrocephalus or ventricular trapping. No intraventricular extension of hemorrhage. Finding favored to be hypertensive in nature.  2. Multiple additional chronic micro hemorrhages centered  about the deep gray nuclei, brainstem, and cerebellum, most characteristic for poorly controlled hypertension.  3. Underlying chronic microvascular ischemic disease with scattered remote lacunar infarcts as above.   MRA HEAD 11/23/2018 1. Technically limited exam due to motion artifact.  2. Grossly negative intracranial MRA. No large vessel occlusion or hemodynamically significant stenosis. No vascular abnormality seen underlying the left thalamic hemorrhage.   Transthoracic Echocardiogram  11/23/2018 1. The left ventricle has normal systolic function with an ejection fraction of 60-65%. The cavity size was normal. Left ventricular diastolic parameters were normal. 2. The right ventricle has normal systolic function. The cavity was normal. There is no increase in right ventricular wall thickness. 3. Mild thickening of the mitral valve leaflet. 4. The aortic valve is tricuspid. Mild thickening of the aortic valve. 5. The aortic root is normal in size and structure. 6. The interatrial septum was not well visualized.  Bilateral Carotid Dopplers  11/23/2018 Right Carotid: The extracranial vessels were near-normal with only minimal wallthickening or plaque. No significant change since prior studydone 08/27/2011. Left Carotid: The extracranial vessels were near-normal with only minimal wallthickening or plaque. No significant change since prior study done4/22/2013. Vertebrals: Bilateral vertebral arteries demonstrate antegrade flow.  Subclavians: Normal flow hemodynamics were seen in bilateral subclavianarteries.  EKG - SR rate 68 BPM. Possible old anterior MI. (See cardiology reading for complete details)   HISTORY OF PRESENT ILLNESS Kristy Vasquez is an 48 y.o. female with HTN, obesity and prior hemorrhagic stroke in 2013 who [facility] with feeling "woozy" and memory changes as well as word finding difficulty and fatigue since Tuesday (LKW 11/18/2018,  time unknown). She denied head trauma or headaches. Also denied limb weakness or limb numbness. No N/V, diarrhea but has had decreased appetite for one week. No fevers. [facility] revealed an acute hemorrhage over the left thalamus/basal ganglia measuring 1.8 x 2.5 cm with adjacent edema and minimal local mass effect compatible with hypertensive bleed. No midline shift. [facility] and treatment.   HOSPITAL COURSE Kristy Vasquez is a 48 y.o. female with history of HTN, obesity and prior hemorrhagic stroke in 2013who [facility]"with[facility] changes,word finding difficulty and fatigue. She was started on antihypertensived and did not receive IV t-PA due to CT showing hemorrhage.  Stroke: acute left thalamus/basal ganglia  hemorrhage - likely due to HTN  CT head - Acute hemorrhage over the left thalamus/basal ganglia measuring 1.8 x 2.5 cm with adjacent edema and minimal local mass effect compatible with hypertensive bleed.  MRI head - 16 x 11 x 12 mm acute intraparenchymal hemorrhage centered at the left thalamus - 4 mm of localized left-to-right midline shift -  multiple additional chronic micro hemorrhages - scattered remote lacunar infarcts.  MRA head - grossly normal  Carotid Doppler - B ICA 1-39% stenosis, VAs antegrade   2D Echo - EF 60-65%. No source of embolus    [employer] Corona Virus 2  - negative  LDL - 137  HgbA1c  - 6.0   UDS  - negative  No antithrombotic prior to admission, now on No antithrombotic  Therapy recommendations:  OP PT, OT, SLP  Disposition:  return home w/ supervision  Hypertension  Stable  Initially treated with labetolol and cleviprex, now off  BP goal normotensive  Hyperlipidemia  LDL 137  on no statin PTA,  Will add statin at d/c  Other Stroke Risk Factors  ETOH use, advised to drink no more than 1 alcoholic beverage per day.  Morbid Obesity, Body mass index is  43.39 kg/m., recommend weight loss, diet and exercise as appropriate   Hx Stroke ? 08/2011 - Prior R thalamic hemorrhage d/t HTN. Previous lacunar infarcts by imaging.  Coronary artery disease - Possible old anterior Mi by ECG  Other Active Problems  Pt reported cognitive decline - have asked SLP to assess  DISCHARGE EXAM Blood pressure 118/72, pulse 70, temperature 98.1 F (36.7 C), temperature source Oral, resp. rate 16, height 5\' 2"  (1.575 m), weight 107.6 kg, last menstrual period 10/22/2018, SpO2 98 %. Pleasant middle-aged African-American lady not in distress. . Afebrile. Head is nontraumatic. Neck is supple without bruit.    Cardiac exam no murmur or gallop. Lungs are clear to auscultation. Distal pulses are well felt.  Neurological Exam :  Awake  Alert oriented x 3.  Slightly nonfluent speech but no paraphasic errors or word finding difficulties.Marland Kitchen.eye movements full without nystagmus.fundi were not visualized. Vision acuity and fields appear normal. Hearing is normal. Palatal movements are normal. Face symmetric. Tongue midline. Normal strength, tone, reflexes and coordination. Normal sensation. Gait deferred.   Discharge Diet   Heart Health thin liquids  DISCHARGE PLAN  Disposition:  Return home w/ supervision  Due to hemorrhage and risk of bleeding, do not take aspirin, aspirin-containing medications, or ibuprofen products   Ongoing stroke risk factor control by Primary Care Physician at time of discharge  Follow-up Tysinger, Kermit BaloDavid S, PA-C in 2 weeks.  Follow-up in [facility] in 4 weeks, office to schedule an appointment.   35 minutes were spent preparing discharge.  Annie MainSharon Biby, MSN, APRN, ANVP-BC, AGPCNP-BC Advanced Practice Stroke Nurse [facility] See Amion for Schedule & Pager information 11/25/2018 2:33 PM   I have personally obtained history,examined this patient, reviewed notes, independently viewed imaging  studies, participated in medical decision making and plan of care.ROS completed by me personally and pertinent positives fully documented  I have made any additions or clarifications directly to the above note. Agree with note above.   Delia HeadyPramod Kadey Mihalic, MD Medical Director [facility] Pager: [phone] 11/25/2018 2:52 PM

## 2018-11-25 NOTE — Progress Notes (Signed)
Pt given discharge summary and discharged via daughter as transportation.  

## 2018-11-25 NOTE — Progress Notes (Signed)
Physical Therapy Treatment Patient Details Name: Kristy Vasquez MRN: [id number] DOB: 1971/01/25 Today's Date: 11/25/2018    History of Present Illness 48 y.o. female with HTN, obesity and prior hemorrhagic stroke in 2013 who presented to [facility] on Saturday evening with feeling "woozy" and memory changes as well as word finding difficulty and fatigue since Tuesday. CT at the [facility] ED revealed an acute hemorrhage over the left thalamus/basal ganglia.    PT Comments    Patient seen for mobility progression. Pt with DGI score of 21/24 and with mild gait deviations noted with challenges. Overall supervision for OOB mobility. Pt presents with word finding difficulties and decreased short term memory. Current plan remains appropriate.     Follow Up Recommendations  Outpatient PT;Supervision/Assistance - 24 hour     Equipment Recommendations  None recommended by PT    Recommendations for Other Services       Precautions / Restrictions Precautions Precautions: Fall Restrictions Weight Bearing Restrictions: No    Mobility  Bed Mobility Overal bed mobility: Modified Independent Bed Mobility: Supine to Sit;Sit to Supine     Supine to sit: Modified independent (Device/Increase time);HOB elevated Sit to supine: Modified independent (Device/Increase time);HOB elevated   General bed mobility comments: pt sitting EOB upon arrival  Transfers Overall transfer level: Modified independent Equipment used: None Transfers: Sit to/from Stand              Ambulation/Gait Ambulation/Gait assistance: Supervision Gait Distance (Feet): 240 Feet Assistive device: None Gait Pattern/deviations: Step-through pattern;Decreased stride length Gait velocity: decreased   General Gait Details: Slow speed; able to perform balance challenges without LOB   Stairs             Wheelchair Mobility    Modified Rankin (Stroke Patients Only) Modified Rankin (Stroke Patients Only) Pre-Morbid  Rankin Score: No symptoms Modified Rankin: Moderate disability     Balance Overall balance assessment: Needs assistance Sitting-balance support: Feet supported;No upper extremity supported Sitting balance-Leahy Scale: Good     Standing balance support: During functional activity Standing balance-Leahy Scale: Good                 High Level Balance Comments: Tolerated above activities without changes in gait or overt LOB. Able to step over objects without difficulty. Standardized Balance Assessment Standardized Balance Assessment : Dynamic Gait Index   Dynamic Gait Index Level Surface: Normal Change in Gait Speed: Mild Impairment Gait with Horizontal Head Turns: Normal Gait with Vertical Head Turns: Mild Impairment Gait and Pivot Turn: Normal Step Over Obstacle: Normal Step Around Obstacles: Normal Steps: Mild Impairment Total Score: 21      Cognition Arousal/Alertness: Awake/alert Behavior During Therapy: WFL for tasks assessed/performed Overall Cognitive Status: Impaired/Different from baseline Area of Impairment: Memory;Problem solving;Attention                     Memory: Decreased short-term memory Following Commands: Follows multi-step commands with increased time Safety/Judgement: Decreased awareness of deficits;Decreased awareness of safety Awareness: Emergent Problem Solving: Slow processing;Difficulty sequencing;Requires verbal cues General Comments: pt presents with word finding difficulties       Exercises      General Comments        Pertinent Vitals/Pain Pain Assessment: No/denies pain    Home Living                      Prior Function            PT Goals (current goals can  now be found in the care plan section) Acute Rehab PT Goals Patient Stated Goal: to go home Progress towards PT goals: Progressing toward goals    Frequency    Min 4X/week      PT Plan Current plan remains appropriate    Co-evaluation               AM-PAC PT "6 Clicks" Mobility   Outcome Measure  Help needed turning from your back to your side while in a flat bed without using bedrails?: None Help needed moving from lying on your back to sitting on the side of a flat bed without using bedrails?: None Help needed moving to and from a bed to a chair (including a wheelchair)?: None Help needed standing up from a chair using your arms (e.g., wheelchair or bedside chair)?: None Help needed to walk in hospital room?: None Help needed climbing 3-5 steps with a railing? : A Little 6 Click Score: 23    End of Session Equipment Utilized During Treatment: Gait belt Activity Tolerance: Patient tolerated treatment well Patient left: in bed;with call bell/phone within reach Nurse Communication: Mobility status PT Visit Diagnosis: Other symptoms and signs involving the nervous system (R29.898)     Time: 1100-1120 PT Time Calculation (min) (ACUTE ONLY): 20 min  Charges:  $Gait Training: 8-22 mins                     Erline LevineKellyn Asalee Barrette, PTA Acute Rehabilitation Services Pager: [phone]) [phone] Office: [phone]     Carolynne EdouardKellyn R Dionisios Ricci 11/25/2018, 2:02 PM

## 2018-11-25 NOTE — Progress Notes (Signed)
Occupational Therapy Treatment Patient Details Name: Kristy Vasquez MRN: [id number] DOB: [birth year] Today's Date: 11/25/2018    History of present illness 48 y.o. female with HTN, obesity and prior hemorrhagic stroke in 2013 who [facility] with feeling "woozy" and memory changes as well as word finding difficulty and fatigue since Tuesday. [facility] an acute hemorrhage over the left thalamus/basal ganglia.   OT comments  Pt was given pill box test this session and was able to correctly read the labels when asked. She was given 5 minutes to fill organizer for 1 week. Pt able to fill one days worth of medication within 5 minutes and it had multiple errors. OT reviewed these results with pt and how problem solving deficits can effect safely with functional tasks like cooking, driving, and current job. Pt will continue to need 24/7 S at home for safety and outpatient OT services. Pt continues to report she is at baseline and ready to return to work but clearly has decreased insight to cognitive deficits. Pt would continue to benefit from OT intervention.   Follow Up Recommendations  Outpatient OT;Supervision/Assistance - 24 hour    Equipment Recommendations  None recommended by OT       Precautions / Restrictions Precautions Precautions: Fall Restrictions Weight Bearing Restrictions: No       Mobility Bed Mobility Overal bed mobility: Modified Independent Bed Mobility: Supine to Sit;Sit to Supine     Supine to sit: Modified independent (Device/Increase time);HOB elevated Sit to supine: Modified independent (Device/Increase time);HOB elevated   General bed mobility comments: No assist needed. No dizziness.     Balance Overall balance assessment: Needs assistance Sitting-balance support: Feet supported;No upper extremity supported Sitting balance-Leahy Scale: Good        ADL either performed or assessed with clinical judgement         Vision Baseline Vision/History: Wears glasses Wears Glasses: At all times            Cognition Arousal/Alertness: Awake/alert Behavior During Therapy: Flat affect Overall Cognitive Status: Impaired/Different from baseline Area of Impairment: Awareness;Problem solving;Memory        Memory: Decreased short-term memory Following Commands: Follows multi-step commands with increased time Safety/Judgement: Decreased awareness of deficits;Decreased awareness of safety Awareness: Emergent Problem Solving: Slow processing;Difficulty sequencing;Requires verbal cues                     Pertinent Vitals/ Pain       Pain Assessment: No/denies pain         Frequency  Min 3X/week        Progress Toward Goals  OT Goals(current goals can now be found in the care plan section)  Progress towards OT goals: Progressing toward goals  Acute Rehab OT Goals Patient Stated Goal: to go home OT Goal Formulation: With patient Time For Goal Achievement: 12/09/18 Potential to Achieve Goals: Good  Plan Discharge plan remains appropriate       AM-PAC OT "6 Clicks" Daily Activity     Outcome Measure   Help from another person eating meals?: None Help from another person taking care of personal grooming?: None Help from another person toileting, which includes using toliet, bedpan, or urinal?: A Little Help from another person bathing (including washing, rinsing, drying)?: A Little Help from another person to put on and taking off regular upper body clothing?: None Help from another person to put on and taking off regular lower body clothing?: A  Little 6 Click Score: 21    End of Session Equipment Utilized During Treatment: Gait belt  OT Visit Diagnosis: Unsteadiness on feet (R26.81);Other abnormalities of gait and mobility (R26.89);Muscle weakness (generalized) (M62.81);Pain   Activity Tolerance Patient tolerated treatment well   Patient Left in bed;with call bell/phone  within reach;with bed alarm set   Nurse Communication Mobility status;Precautions        Time: 8682-5749 OT Time Calculation (min): 20 min  Charges: OT General Charges $OT Visit: 1 Visit OT Treatments $Therapeutic Activity: 8-22 mins   Adrienna Karis P, MS, OTR/L 11/25/2018, 11:41 AM

## 2018-11-27 ENCOUNTER — Other Ambulatory Visit: Payer: Self-pay | Admitting: *Deleted

## 2018-11-27 NOTE — Patient Outreach (Signed)
[facility]) Care Management  11/27/2018  Chantay Whitelock 29-Jul-1970 [id number]   Transition of care call   Referral received : 7/22 Initial outreach :7/23 Insurance : [place]    Subjective Initial outreach call to patient preferred outreach number in order to complete transition of care assessment, 2 HIPAA identifiers verified. Explained purpose of the call and completed transition of care assessment.  Patient states that she is doing good on today, she discussed not noting any problems from her stoke other than some memory changes sometimes having difficulty finding the right words.  Patient denies having any dizziness, any new noted weakness, difficulty walking, no headaches or pain. She discussed tolerating diet well, reports that her daughter is trying to cook with no salt and healthier for her. Patient states that her daughter is at home with her for 24 supervision and able to assist in recovery. Patient reports taking shower on today tolerated well with daughter present. Patient discussed her ongoing medical problem with high blood pressure , discussed [place] chronic disease management programs she was interested in having contact number. Patient dicussed having a blood pressure monitor and home and has previously been recommended to monitor it at home,she has not checked it since being at home but plans to start back checking it and keeping a record to take to PCP appointment.  Patient discussed today being her birthday and plans to go to hairdresser family to assist and provide transportation.   Objective Mrs. Pamelyn Bancroft was hospitalized at [facility] 7/18-7/21 for Cerebral hemorrhage-hypertensive Left thalamic/basal ICH.  Comorbidites include: Hypertension, obesity , hyperlipidemia, Patient was discharged home on 7/21.  Noted inpatient evaluation by speech , physical therapy and occupation for outpatient therapy.   Assessment  Patient has  good understanding of all discharge instructions.  See transition of care flow sheet for assessment details Patient identifies Dr.Veita Criss Rosales as being her Primary Care Provider and understands to call to schedule post discharge visit.  Placed call to [facility] for follow up on outpatient services, spoke with Bary Castilla that verifies patient having referrals for outpatient , speech therapy, occupational and physical therapy and will contacted by office for scheduling at their center.[facility] office referral coordinator will place call to patient regarding follow up office visit. Plan Reviewed hospital discharge diagnosis of Cerebral hemorrhage , hypertensive Left thalamic basal ganglia, stroke  and treatment plan using hospital discharge instructions, assessing medication adherence, reviewing postoperative problems requiring provider notification and discussing the importance of follow up with  primary care provider and specialist as directed. Reviewed [place] Active Health Management 2020 Wellness Requirements of : Completing the computerized Health Assessment and the Health Action Step with Active Health Management [facility]) by January 06 2019 AND have an annual physical between May 07, 2017 and January 06, 2019 in order to qualify for the 2021 Healthy Lifestyle Premium.  Reviewed [place]'s chronic disease management program benefit with Active Health Management and encouraged to contact them at [phone] or at [email or web address] to enroll or for questions about managing his /her chronic disease states. Patient wrote down number to make contact with program at a later date and able to read back number.  Reviewed 's Spiritual Care Advanced Directive document completion benefit and current waiver of 2 witnesses, and only notary required due to Covid-19 state of emergency. Patient denied need for advanced directive mailing packet.  Patient agreeable to  receiving education handouts on heart healthy diet and hypertension mailed to her  home.  Patient will benefit from follow up call in the next week to verify receiving information and post discharge medical appointments in place and she is agreeable.  Will schedule follow up call in next week.   Management  services and route successful outreach letter with [facility] Care Management pamphlet and 24 Hour Nurse Line Magnet to [facility] Care [facility] clinical pool to be mailed to patient's home address.   1530  Attempted return call to patient to update on [facility] regarding outpatient therapy plans, no answer unable to leave a message.  Will plan return call on next business day.   Egbert GaribaldiKimberly , RN, [facility] Care [facility],Care Management Coordinator  [phone]- Mobile [phone]- Toll Free Main Office

## 2018-11-28 ENCOUNTER — Other Ambulatory Visit: Payer: Self-pay | Admitting: *Deleted

## 2018-11-28 NOTE — Patient Outreach (Addendum)
[facility]) Care Management  11/28/2018  Kristy Vasquez 1970-10-24 [id number]   Telephone follow up call Transition of care call   Referral received : 7/22 Initial outreach :7/23 Insurance : [place]    Subjective Successful follow up call to patient, after initial telephone call on yesterday, call to today to discuss outpatient neurology follow up.  Reviewed with patient follow up call to Neurology office made on yesterday, informed per office patient will received follow up from referral coordinator regarding next appointment and a call regarding scheduling outpatient physical therapy, occupational therapy as well as speech therapy that has been recommended by neurologist.  Patient voiced understanding of follow up plans.  Patient reports that she is doing okay today and she has contacted Dr.Bland office regarding an appointment and awaiting a return call.   Plan Will plan follow up call in the next week and case closure if no new concerns.   Joylene Draft, RN, [place] Management Coordinator  [phone]- Mobile [phone]- Toll Free Main Office

## 2018-12-04 ENCOUNTER — Other Ambulatory Visit: Payer: Self-pay | Admitting: *Deleted

## 2018-12-04 DIAGNOSIS — E1169 Type 2 diabetes mellitus with other specified complication: Secondary | ICD-10-CM | POA: Diagnosis not present

## 2018-12-04 DIAGNOSIS — I10 Essential (primary) hypertension: Secondary | ICD-10-CM | POA: Diagnosis not present

## 2018-12-04 NOTE — Patient Outreach (Signed)
[place] [facility] [facility]) Care Management  12/04/2018  Kristy Vasquez 03-19-1971 [id number]   Telephone follow up to Transition of care call     Referral received : 7/22 Initial outreach :7/23 Insurance : [place]    Subjective  Unsuccessful telephone follow up call to patient , no answer able to leave a HIPAA compliant voice message for return call.   Objective: Mrs. Kristy Vasquez was hospitalized at [facility] 7/18-7/21 for Cerebral hemorrhage-hypertensive Left thalamic/basal ICH.  Comorbidites include: Hypertension, obesity , hyperlipidemia, Patient was discharged home on 7/21.  Noted inpatient evaluation by speech , physical therapy and occupation for outpatient therapy.  For outpatient therapy to be arranged by [facility] neurology.   Plan Will await return call from patient to verify post discharge visit with PCP scheduled  and outpatient follow up with [place] neurology, for outpatient therapies arranged.  Placed call to [facility] office they have been unable to contact patient.  Will plan follow up call in the next 4 business days.    Joylene Draft, RN, [place] Management Coordinator  [phone]- Mobile [phone]- Toll Free Main Office

## 2018-12-09 ENCOUNTER — Other Ambulatory Visit: Payer: Self-pay | Admitting: *Deleted

## 2018-12-09 NOTE — Patient Outreach (Addendum)
[facility]) Care Management  12/09/2018  Kristy Vasquez 24-Oct-1970 [id number]   Telephone follow up to Transition of care call     Referral received : 7/22 Initial outreach :7/23 Insurance : [place]    Subjective  Unsuccessful telephone follow up call to patient , no answer able to leave a HIPAA compliant voice message for return call.   Objective: Mrs. Kristy Vasquez was hospitalized at [facility] 7/18-7/21 for Cerebral hemorrhage-hypertensive Left thalamic/basal ICH.  Comorbidites include: Hypertension, obesity , hyperlipidemia, Patient was discharged home on 7/21.  Noted inpatient evaluation by speech , physical therapy and occupation for outpatient therapy.  For outpatient therapy to be arranged by [facility] neurology.   Plan Will await return call from patient to verify post discharge visit with PCP scheduled  and outpatient follow up with [place] neurology, for outpatient therapies arranged.    Will plan follow up call in the next 4 business days.   Joylene Draft, RN, [place] Management Coordinator  [phone]- Mobile [phone]- Toll Free Main Office

## 2018-12-12 ENCOUNTER — Other Ambulatory Visit: Payer: Self-pay | Admitting: *Deleted

## 2018-12-12 ENCOUNTER — Ambulatory Visit: Payer: Self-pay | Admitting: *Deleted

## 2018-12-12 NOTE — Patient Outreach (Signed)
[facility]) Care Management  12/12/2018  Kristy Vasquez Nov 23, 1970 [id number]   Transition of Care/Case Closure Referral received: 11/26/18 Initial outreach 7/23 Insurance: [place]  Patient did not return call and it has been greater than 10 days since initial successful outreach on 11/27/18.  Plan: Will close case to [place] Management services.  Barrington Ellison RN,CCM,CDE [place] Management Coordinator Office Phone [phone] Office Fax [phone]

## 2018-12-12 NOTE — Patient Outreach (Signed)
[facility]) Care Management  12/12/2018  Kristy Vasquez 07-25-1970 [id number]   Transition of Care follow up outreach Referral received: 11/26/18 Initial outreach 7/23 Insurance: [place]  Third unsuccessful call to Kristy Vasquez contact number to confirm follow up appointments with primary care provider and outpatient rehabilitation for therapies to treat deficits related to patient's 7/18 hemorraghic stroke .  Objective: Kristy Vasquez was hospitalized at [facility] 7/18-7/21 for Cerebral hemorrhage-hypertensive Left thalamic/basal ICH.  Comorbidites include: Hypertension, obesity , hyperlipidemia, Patient was discharged home on 7/21.  Noted inpatient evaluation by speech , physical therapy and occupation for outpatient therapy. For outpatient therapy to be arranged by [facility] neurology.    Plan: If no return call from patient by end of business day today , will close case to [place] Management services as it has been >10 business days since initial successful outreach.   Barrington Ellison RN,CCM,CDE [place] Management Coordinator Office Phone [phone] Office Fax [phone]

## 2018-12-16 DIAGNOSIS — E669 Obesity, unspecified: Secondary | ICD-10-CM | POA: Diagnosis not present

## 2018-12-16 DIAGNOSIS — I10 Essential (primary) hypertension: Secondary | ICD-10-CM | POA: Diagnosis not present

## 2018-12-16 DIAGNOSIS — E785 Hyperlipidemia, unspecified: Secondary | ICD-10-CM | POA: Diagnosis not present

## 2018-12-16 DIAGNOSIS — I119 Hypertensive heart disease without heart failure: Secondary | ICD-10-CM | POA: Diagnosis not present

## 2018-12-26 MED FILL — OLMESARTAN-AMLODIPINE-HCTZ: 40-10-25 | 30 days supply | Qty: 30 | Fill #0

## 2019-01-16 DIAGNOSIS — I119 Hypertensive heart disease without heart failure: Secondary | ICD-10-CM | POA: Diagnosis not present

## 2019-01-16 DIAGNOSIS — I10 Essential (primary) hypertension: Secondary | ICD-10-CM | POA: Diagnosis not present

## 2019-01-16 DIAGNOSIS — E785 Hyperlipidemia, unspecified: Secondary | ICD-10-CM | POA: Diagnosis not present

## 2019-01-16 DIAGNOSIS — E1169 Type 2 diabetes mellitus with other specified complication: Secondary | ICD-10-CM | POA: Diagnosis not present

## 2019-02-06 MED FILL — OLMESARTAN-AMLODIPINE-HCTZ: 40-10-25 | 30 days supply | Qty: 30 | Fill #1

## 2019-03-13 DIAGNOSIS — Z135 Encounter for screening for eye and ear disorders: Secondary | ICD-10-CM | POA: Diagnosis not present

## 2019-03-13 DIAGNOSIS — H524 Presbyopia: Secondary | ICD-10-CM | POA: Diagnosis not present

## 2019-03-13 DIAGNOSIS — H52223 Regular astigmatism, bilateral: Secondary | ICD-10-CM | POA: Diagnosis not present

## 2019-03-30 DIAGNOSIS — I10 Essential (primary) hypertension: Secondary | ICD-10-CM | POA: Diagnosis not present

## 2019-03-30 DIAGNOSIS — E782 Mixed hyperlipidemia: Secondary | ICD-10-CM | POA: Diagnosis not present

## 2019-03-30 DIAGNOSIS — E1169 Type 2 diabetes mellitus with other specified complication: Secondary | ICD-10-CM | POA: Diagnosis not present

## 2019-03-31 MED FILL — OLMSRTN-AMLDPN-HCTZ 40-10-2: 40-10-25 | 90 days supply | Qty: 90 | Fill #0

## 2019-08-03 MED FILL — OLMSRTN-AMLDPN-HCTZ 40-10-2: 40-10-25 | 90 days supply | Qty: 90 | Fill #1

## 2019-12-04 DIAGNOSIS — E669 Obesity, unspecified: Secondary | ICD-10-CM | POA: Diagnosis not present

## 2019-12-04 DIAGNOSIS — I10 Essential (primary) hypertension: Secondary | ICD-10-CM | POA: Diagnosis not present

## 2019-12-04 MED FILL — OLMSRTN-AMLDPN-HCTZ 40-10-2: 40-10-25 | 90 days supply | Qty: 90 | Fill #0

## 2019-12-04 MED FILL — SPIRONOLACTONE 25 MG TABS: 25 | 90 days supply | Qty: 90 | Fill #0

## 2020-03-04 ENCOUNTER — Other Ambulatory Visit: Payer: Self-pay | Admitting: Internal Medicine

## 2020-03-04 ENCOUNTER — Ambulatory Visit: Payer: 59 | Attending: Internal Medicine

## 2020-03-04 DIAGNOSIS — Z23 Encounter for immunization: Secondary | ICD-10-CM

## 2020-03-04 NOTE — Progress Notes (Signed)
   Covid-19 Vaccination Clinic  Name:  Nevena Rozenberg    MRN: [id number] DOB: January 30, 1971  03/04/2020  Ms. Shankle was observed post Covid-19 immunization for 15 minutes without incident. She was provided with Vaccine Information Sheet and instruction to access the V-Safe system.   Ms. Davoli was instructed to call 911 with any severe reactions post vaccine: Marland Kitchen Difficulty breathing  . Swelling of face and throat  . A fast heartbeat  . A bad rash all over body  . Dizziness and weakness

## 2020-08-17 DIAGNOSIS — H52223 Regular astigmatism, bilateral: Secondary | ICD-10-CM | POA: Diagnosis not present

## 2020-08-17 DIAGNOSIS — H524 Presbyopia: Secondary | ICD-10-CM | POA: Diagnosis not present

## 2020-10-25 ENCOUNTER — Ambulatory Visit: Payer: Self-pay | Attending: Internal Medicine

## 2020-10-25 ENCOUNTER — Other Ambulatory Visit: Payer: Self-pay

## 2020-10-25 DIAGNOSIS — Z23 Encounter for immunization: Secondary | ICD-10-CM

## 2020-10-25 MED ORDER — PFIZER-BIONT COVID-19 VAC-TRIS 30 MCG/0.3ML IM SUSP
INTRAMUSCULAR | 0 refills | Status: AC
  Filled 2020-10-25: qty 0.3, 15d supply, fill #0

## 2020-10-25 NOTE — Progress Notes (Signed)
   Covid-19 Vaccination Clinic  Name:  Kristy Vasquez    MRN: [id number] DOB: 05-24-1970  10/25/2020  Kristy Vasquez was observed post Covid-19 immunization for 15 minutes without incident. She was provided with Vaccine Information Sheet and instruction to access the V-Safe system.   Kristy Vasquez was instructed to call 911 with any severe reactions post vaccine: Difficulty breathing  Swelling of face and throat  A fast heartbeat  A bad rash all over body  Dizziness and weakness   Immunizations Administered     Name Date Dose VIS Date Route   PFIZER Comrnaty(Gray TOP) Covid-19 Vaccine 10/25/2020 12:21 PM 0.3 mL 04/14/2020 Intramuscular   Manufacturer: [employer], [employer]   Lot: [id number]   NDC: [phone]

## 2020-11-13 ENCOUNTER — Ambulatory Visit (HOSPITAL_COMMUNITY): Admission: EM | Admit: 2020-11-13 | Discharge: 2020-11-13 | Payer: No Typology Code available for payment source

## 2020-11-13 ENCOUNTER — Encounter (HOSPITAL_COMMUNITY): Payer: Self-pay | Admitting: *Deleted

## 2020-11-13 ENCOUNTER — Other Ambulatory Visit: Payer: Self-pay

## 2020-11-13 ENCOUNTER — Emergency Department (HOSPITAL_BASED_OUTPATIENT_CLINIC_OR_DEPARTMENT_OTHER)
Admission: EM | Admit: 2020-11-13 | Discharge: 2020-11-13 | Disposition: A | Discharge status: Home / Self Care | Payer: No Typology Code available for payment source | Attending: Emergency Medicine | Admitting: Emergency Medicine

## 2020-11-13 ENCOUNTER — Emergency Department (HOSPITAL_BASED_OUTPATIENT_CLINIC_OR_DEPARTMENT_OTHER): Payer: No Typology Code available for payment source

## 2020-11-13 ENCOUNTER — Encounter (HOSPITAL_BASED_OUTPATIENT_CLINIC_OR_DEPARTMENT_OTHER): Payer: Self-pay | Admitting: Obstetrics and Gynecology

## 2020-11-13 DIAGNOSIS — S8992XA Unspecified injury of left lower leg, initial encounter: Secondary | ICD-10-CM | POA: Diagnosis present

## 2020-11-13 DIAGNOSIS — S76312A Strain of muscle, fascia and tendon of the posterior muscle group at thigh level, left thigh, initial encounter: Secondary | ICD-10-CM | POA: Diagnosis not present

## 2020-11-13 DIAGNOSIS — I10 Essential (primary) hypertension: Secondary | ICD-10-CM | POA: Insufficient documentation

## 2020-11-13 DIAGNOSIS — X58XXXA Exposure to other specified factors, initial encounter: Secondary | ICD-10-CM | POA: Diagnosis not present

## 2020-11-13 DIAGNOSIS — Z79899 Other long term (current) drug therapy: Secondary | ICD-10-CM | POA: Insufficient documentation

## 2020-11-13 DIAGNOSIS — S76311A Strain of muscle, fascia and tendon of the posterior muscle group at thigh level, right thigh, initial encounter: Secondary | ICD-10-CM

## 2020-11-13 DIAGNOSIS — M79605 Pain in left leg: Secondary | ICD-10-CM | POA: Diagnosis not present

## 2020-11-13 DIAGNOSIS — S86911A Strain of unspecified muscle(s) and tendon(s) at lower leg level, right leg, initial encounter: Secondary | ICD-10-CM | POA: Diagnosis not present

## 2020-11-13 NOTE — ED Triage Notes (Signed)
Pt reports Lt popliteal pain for 3 weeks.

## 2020-11-13 NOTE — ED Notes (Signed)
Patient is being discharged from the Urgent Care and sent to the Emergency Department via personal vehicle . Per Provider Chales Salmon, patient is in need of higher level of care due to possible DVT. Patient is aware and verbalizes understanding of plan of care.   Vitals:   11/13/20 1109  BP: (!) 162/93  Pulse: 67  Resp: 18  Temp: 98.4 F (36.9 C)  SpO2: 100%

## 2020-11-13 NOTE — Discharge Instructions (Signed)
Take 4 over the counter ibuprofen tablets 3 times a day or 2 over-the-counter naproxen tablets twice a day for pain. Also take tylenol 1000mg(2 extra strength) four times a day.    

## 2020-11-13 NOTE — ED Triage Notes (Signed)
Patient reports back left leg pain for 3 weeks. Sent from [facility] for DVT R/o.

## 2020-11-13 NOTE — ED Provider Notes (Signed)
[facility] EMERGENCY DEPT Provider Note   CSN: [id number] Arrival date & time: 11/13/20  1232     History Chief Complaint  Patient presents with   Leg Pain    Kristy Vasquez is a 50 y.o. female.  50 yo F with a chief complaints of left posterior leg pain.  Going on for about 3 weeks now.  No obvious injury.  Worse with ambulation and bearing weight.  Has been going up and down the stairs a bit more think it might help but has not significantly changed her symptoms.  Went to urgent care today and was told that she needs to come to the ED for evaluation for DVT.  No break in the skin no redness no fevers.  The history is provided by the patient.  Leg Pain Location:  Leg Time since incident:  3 weeks Injury: no   Leg location:  L upper leg Pain details:    Quality:  Aching   Radiates to:  Does not radiate   Severity:  Moderate   Onset quality:  Gradual   Duration:  3 weeks   Timing:  Constant   Progression:  Worsening Chronicity:  New Prior injury to area:  No Relieved by:  Nothing Worsened by:  Bearing weight Ineffective treatments:  None tried Associated symptoms: no fever       Past Medical History:  Diagnosis Date   Hypertension 2013   Obesity    Stroke [facility]) 2013   hemorrhagic stroke    Patient Active Problem List   Diagnosis Date Noted   Hyperlipemia 11/25/2018   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     Family History  Problem Relation Age of Onset   Cancer Other    Diabetes Other     Social History   Tobacco Use   Smoking status: Never   Smokeless tobacco: Never  Vaping Use   Vaping Use: Never  used  Substance Use Topics   Alcohol use: Yes   Drug use: No    Home Medications Prior to Admission medications   Medication Sig Start Date End Date Taking? Authorizing Provider  atorvastatin (LIPITOR) 40 MG tablet Take 1 tablet (40 mg total) by mouth daily at 6 PM. 11/25/18   Layne Benton, NP  COVID-19 mRNA Vac-TriS, Pfizer, (PFIZER-BIONT COVID-19 VAC-TRIS) SUSP injection Inject into the muscle. 10/25/20   Judyann Munson, MD  COVID-19 mRNA vaccine, Pfizer, 30 MCG/0.3ML injection USE AS DIRECTED 03/04/20 03/04/21  Judyann Munson, MD  NOREL AD 4-10-325 MG TABS Take 1 tablet by mouth every 6 (six) hours as needed (for allergies).  10/03/18   [provider]  Olmesartan-amLODIPine-HCTZ 40-10-25 MG TABS Take 1 tablet by mouth daily. 10/02/18   [provider]  spironolactone (ALDACTONE) 25 MG tablet Take 1 tablet (25 mg total) by mouth daily. 11/26/18   Layne Benton, NP  Vitamin D, Ergocalciferol, (DRISDOL) 1.25 MG (50000 UT) CAPS capsule Take 50,000 Units by mouth every Sunday.  10/21/18   [provider]    Allergies    Patient has no known allergies.  Review of Systems   Review of  Systems  Constitutional:  Negative for chills and fever.  HENT:  Negative for congestion and rhinorrhea.   Eyes:  Negative for redness and visual disturbance.  Respiratory:  Negative for shortness of breath and wheezing.   Cardiovascular:  Negative for chest pain and palpitations.  Gastrointestinal:  Negative for nausea and vomiting.  Genitourinary:  Negative for dysuria and urgency.  Musculoskeletal:  Positive for arthralgias and myalgias.  Skin:  Negative for pallor and wound.  Neurological:  Negative for dizziness and headaches.   Physical Exam Updated Vital Signs BP (!) 176/113   Pulse (!) 58   Temp 98 F (36.7 C) (Oral)   Resp 18   Ht 5\' 2"  (1.575 m)   Wt 99.8 kg   LMP 10/18/2020   SpO2 97%   BMI 40.24 kg/m   Physical Exam Vitals and nursing note reviewed.   Constitutional:      General: She is not in acute distress.    Appearance: She is well-developed. She is not diaphoretic.  HENT:     Head: Normocephalic and atraumatic.  Eyes:     Pupils: Pupils are equal, round, and reactive to light.  Cardiovascular:     Rate and Rhythm: Normal rate and regular rhythm.     Heart sounds: No murmur heard.   No friction rub. No gallop.  Pulmonary:     Effort: Pulmonary effort is normal.     Breath sounds: No wheezing or rales.  Abdominal:     General: There is no distension.     Palpations: Abdomen is soft.     Tenderness: There is no abdominal tenderness.  Musculoskeletal:        General: Tenderness present.     Cervical back: Normal range of motion and neck supple.     Comments: No appreciable swelling compared to the other side.  Pulse motor and sensation intact.  Pain focally along the hamstring.  Birthmark to the lateral aspect of the left knee.  Skin:    General: Skin is warm and dry.  Neurological:     Mental Status: She is alert and oriented to person, place, and time.  Psychiatric:        Behavior: Behavior normal.    ED Results / Procedures / Treatments   Labs (all labs ordered are listed, but only abnormal results are displayed) Labs Reviewed - No data to display  EKG None  Radiology 10/20/2020 Venous Img Lower Unilateral Left (DVT)  Result Date: 11/13/2020 CLINICAL DATA:  Left anterior knee pain for 3 weeks. Swelling. No known injury. EXAM: LEFT LOWER EXTREMITY VENOUS DOPPLER ULTRASOUND TECHNIQUE: Gray-scale sonography with compression, as well as color and duplex ultrasound, were performed to evaluate the deep venous system(s) from the level of the common femoral vein through the popliteal and proximal calf veins. COMPARISON:  None. FINDINGS: VENOUS Normal compressibility of the common femoral, superficial femoral, and popliteal veins, as well as the visualized calf veins. Visualized portions of profunda femoral vein and great saphenous  vein unremarkable. No filling defects to suggest DVT on grayscale or color Doppler imaging. Doppler waveforms show normal direction of venous flow, normal respiratory plasticity and response to augmentation. Limited views of the contralateral common femoral vein are unremarkable. OTHER None. Limitations: none IMPRESSION: Negative. Electronically Signed   By: 01/14/2021 M.D.   On: 11/13/2020 13:30    Procedures Procedures   Medications Ordered in ED Medications - No data to display  ED Course  I have reviewed the triage  vital signs and the nursing notes.  Pertinent labs & imaging results that were available during my care of the patient were reviewed by me and considered in my medical decision making (see chart for details).    MDM Rules/Calculators/A&P                          50 yo F with a chief complaints of left posterior leg pain going on for about 3 weeks now.  Clinically the patient most likely has a hamstring strain.  Sent from urgent care for DVT study.  Will obtain study.  DVT study is negative.  Treat as hamstring strain.  PCP follow-up.  2:55 PM:  I have discussed the diagnosis/risks/treatment options with the patient and believe the pt to be eligible for discharge home to follow-up with PCP. We also discussed returning to the ED immediately if new or worsening sx occur. We discussed the sx which are most concerning (e.g., sudden worsening pain, fever, inability to tolerate by mouth) that necessitate immediate return. Medications administered to the patient during their visit and any new prescriptions provided to the patient are listed below.  Medications given during this visit Medications - No data to display   The patient appears reasonably screen and/or stabilized for discharge and I doubt any other medical condition or other [facility] requiring further screening, evaluation, or treatment in the ED at this time prior to discharge.    Final Clinical Impression(s) / ED  Diagnoses Final diagnoses:  Hamstring strain, right, initial encounter    Rx / DC Orders ED Discharge Orders     None        Melene Plan, DO 11/13/20 1455

## 2021-06-30 DIAGNOSIS — Z6841 Body Mass Index (BMI) 40.0 and over, adult: Secondary | ICD-10-CM | POA: Diagnosis not present

## 2021-06-30 DIAGNOSIS — I10 Essential (primary) hypertension: Secondary | ICD-10-CM | POA: Diagnosis not present

## 2021-06-30 DIAGNOSIS — E782 Mixed hyperlipidemia: Secondary | ICD-10-CM | POA: Diagnosis not present

## 2021-07-03 ENCOUNTER — Other Ambulatory Visit (HOSPITAL_COMMUNITY): Payer: Self-pay

## 2021-07-03 MED ORDER — OZEMPIC (0.25 OR 0.5 MG/DOSE) 2 MG/1.5ML ~~LOC~~ SOPN
0.2500 mg | PEN_INJECTOR | SUBCUTANEOUS | 0 refills | Status: DC
  Filled 2021-07-03: qty 1.5, 56d supply, fill #0
  Filled 2021-07-03: qty 1.5, 28d supply, fill #0
  Filled 2021-08-03: qty 1.5, 56d supply, fill #0
  Filled 2021-08-24: qty 1.5, 28d supply, fill #0

## 2021-07-03 MED ORDER — AMLODIPINE-VALSARTAN-HCTZ 10-320-25 MG PO TABS
1.0000 | ORAL_TABLET | Freq: Every day | ORAL | 2 refills | Status: AC
  Filled 2021-07-03: qty 90, 90d supply, fill #0
  Filled 2021-07-03: qty 30, 30d supply, fill #0
  Filled 2021-11-21: qty 30, 30d supply, fill #1

## 2021-07-04 ENCOUNTER — Other Ambulatory Visit (HOSPITAL_COMMUNITY): Payer: Self-pay

## 2021-08-03 ENCOUNTER — Other Ambulatory Visit (HOSPITAL_COMMUNITY): Payer: Self-pay

## 2021-08-03 DIAGNOSIS — Z713 Dietary counseling and surveillance: Secondary | ICD-10-CM | POA: Diagnosis not present

## 2021-08-03 DIAGNOSIS — R7303 Prediabetes: Secondary | ICD-10-CM | POA: Diagnosis not present

## 2021-08-03 DIAGNOSIS — Z6841 Body Mass Index (BMI) 40.0 and over, adult: Secondary | ICD-10-CM | POA: Diagnosis not present

## 2021-08-03 DIAGNOSIS — Z0000 Encounter for general adult medical examination without abnormal findings: Secondary | ICD-10-CM | POA: Diagnosis not present

## 2021-08-03 MED ORDER — OZEMPIC (0.25 OR 0.5 MG/DOSE) 2 MG/1.5ML ~~LOC~~ SOPN
0.2500 mg | PEN_INJECTOR | SUBCUTANEOUS | 0 refills | Status: AC
  Filled 2021-08-03: qty 1.5, 56d supply, fill #0
  Filled 2021-08-03: qty 3, 112d supply, fill #0
  Filled 2021-09-12: qty 1.5, 56d supply, fill #0
  Filled 2021-09-12: qty 3, 30d supply, fill #0

## 2021-08-24 ENCOUNTER — Other Ambulatory Visit (HOSPITAL_COMMUNITY): Payer: Self-pay

## 2021-09-04 ENCOUNTER — Other Ambulatory Visit (HOSPITAL_COMMUNITY): Payer: Self-pay

## 2021-09-04 MED ORDER — OZEMPIC (0.25 OR 0.5 MG/DOSE) 2 MG/3ML ~~LOC~~ SOPN
0.2500 mg | PEN_INJECTOR | SUBCUTANEOUS | 0 refills | Status: AC
  Filled 2021-09-04: qty 3, 56d supply, fill #0
  Filled 2021-09-12 – 2021-09-14 (×3): qty 3, 28d supply, fill #0

## 2021-09-12 ENCOUNTER — Other Ambulatory Visit (HOSPITAL_COMMUNITY): Payer: Self-pay

## 2021-09-14 ENCOUNTER — Other Ambulatory Visit (HOSPITAL_COMMUNITY): Payer: Self-pay

## 2021-11-21 ENCOUNTER — Other Ambulatory Visit (HOSPITAL_COMMUNITY): Payer: Self-pay

## 2021-11-22 ENCOUNTER — Other Ambulatory Visit (HOSPITAL_COMMUNITY): Payer: Self-pay

## 2022-01-27 IMAGING — US US EXTREM LOW VENOUS*L*
1 series · 14 of 24 positions shown · non-contrast
Comparison: None.

CLINICAL DATA: Left anterior knee pain for 3 weeks. Swelling. No
known injury.

EXAM:
LEFT LOWER EXTREMITY VENOUS DOPPLER ULTRASOUND
TECHNIQUE: Gray-scale sonography with compression, as well as color and duplex
ultrasound, were performed to evaluate the deep venous system(s)
from the level of the common femoral vein through the popliteal and
proximal calf veins.

[Series 1: us venous img lower uni left (dvt) · portal-venous · 14 of 53 slices shown]
[im 1/53]
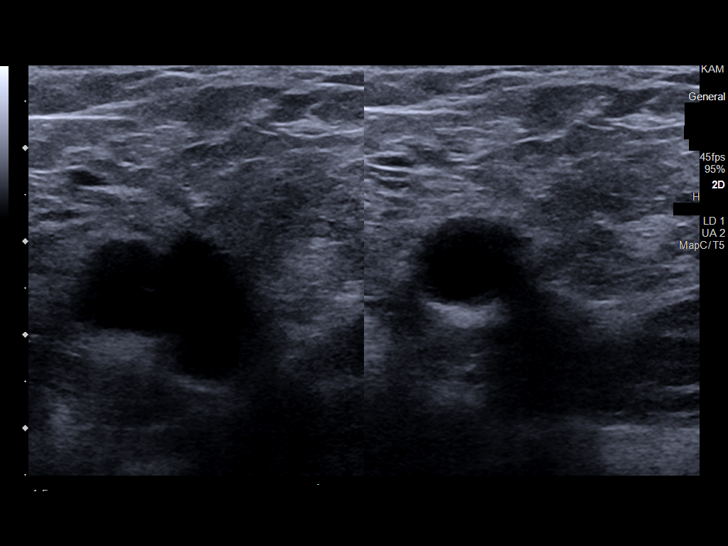
[im 5/53]
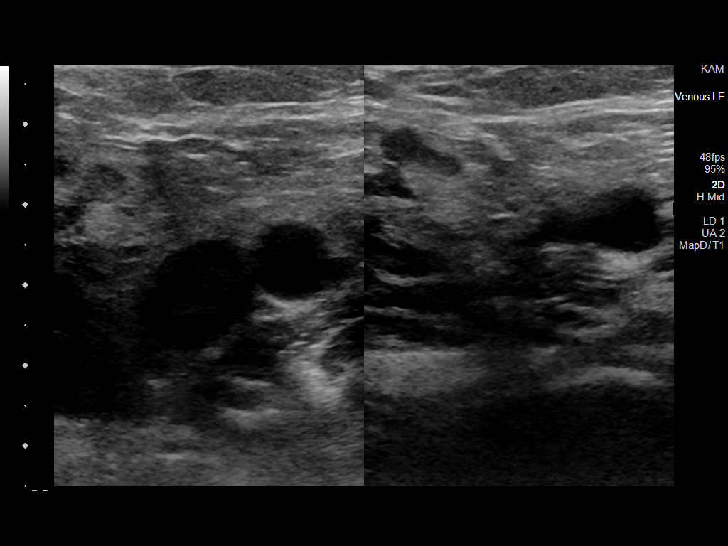
[im 10/53]
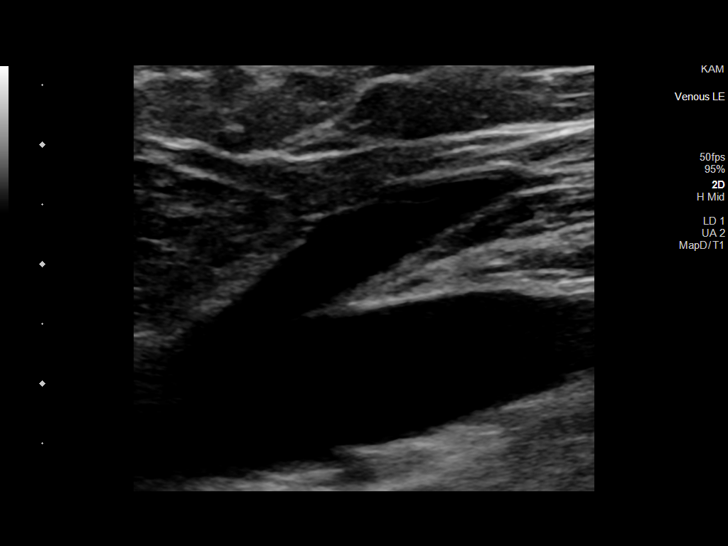
[im 14/53]
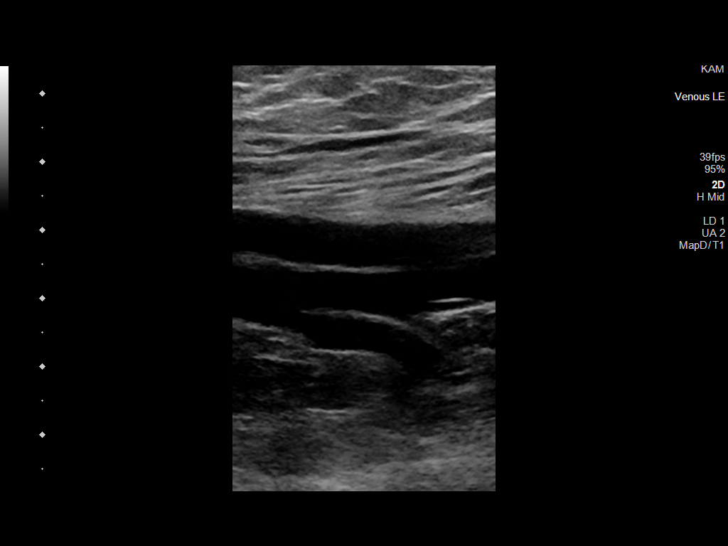
[im 16/53]
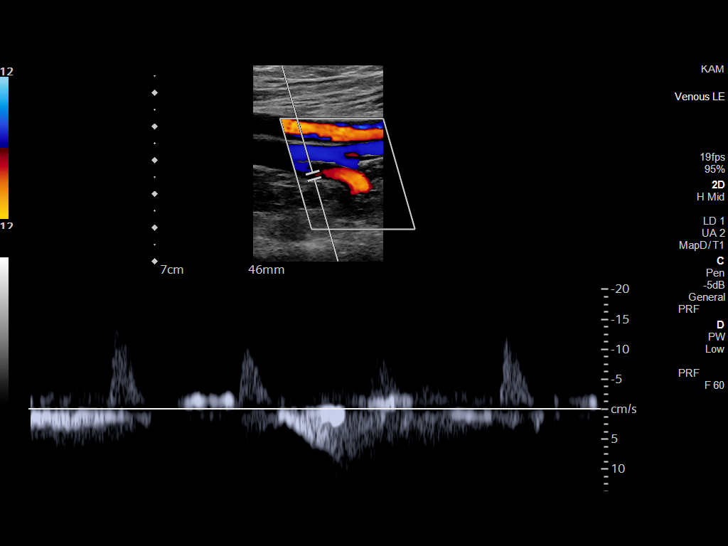
[im 21/53]
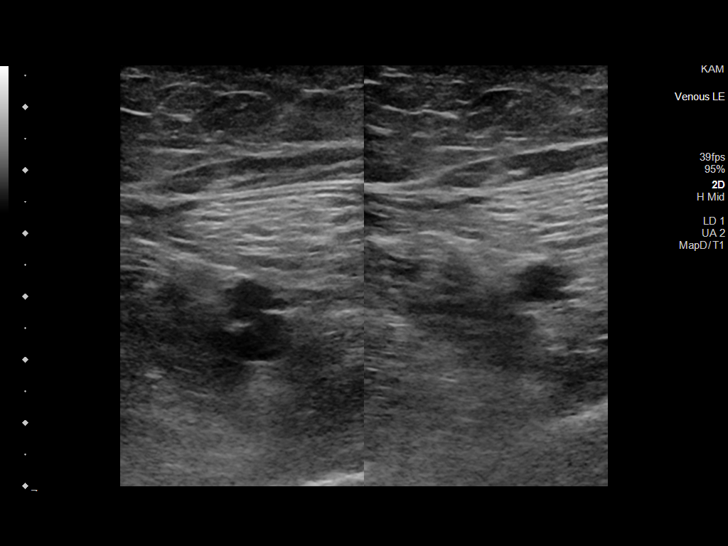
[im 25/53]
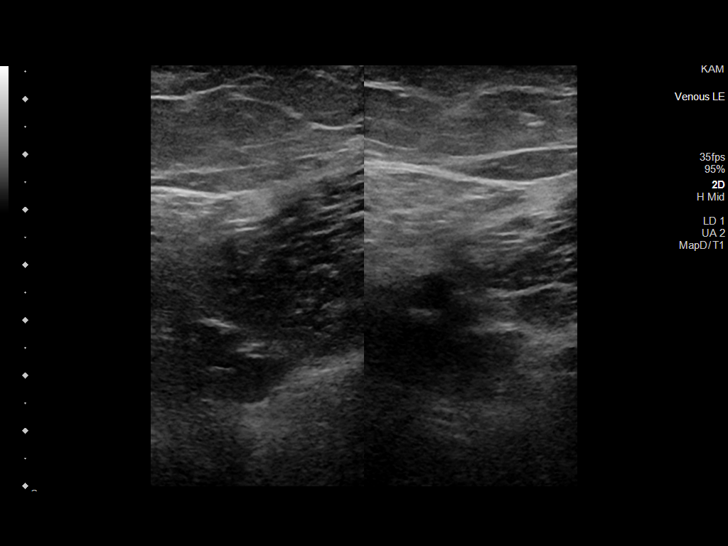
[im 28/53]
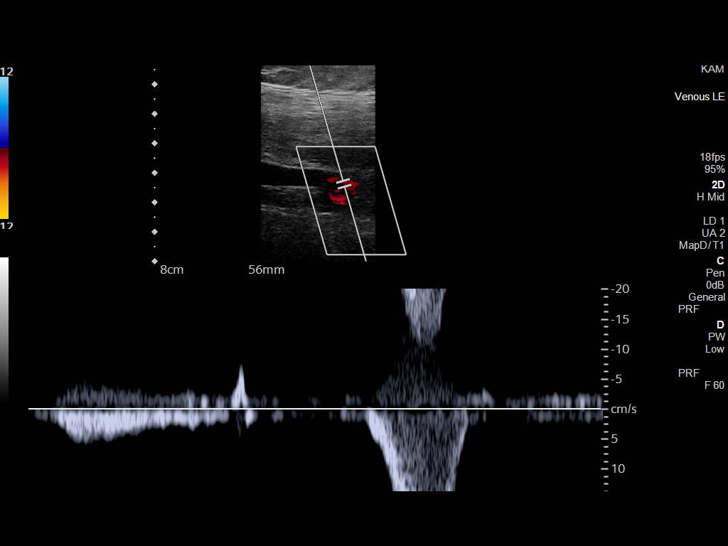
[im 32/53]
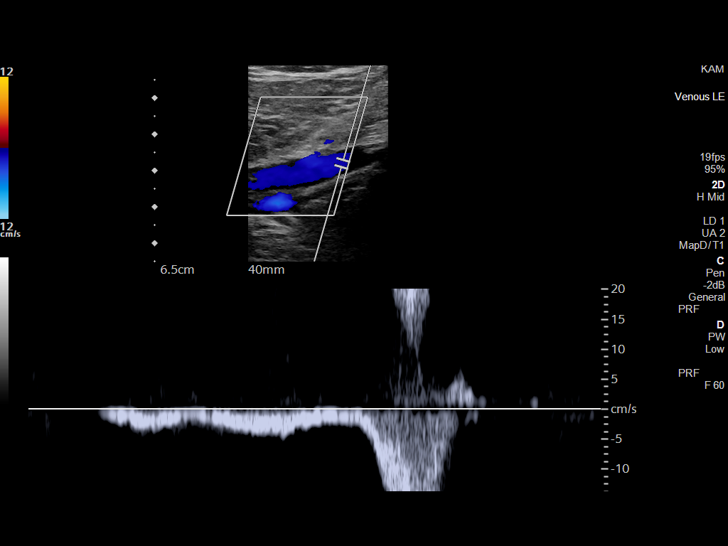
[im 37/53]
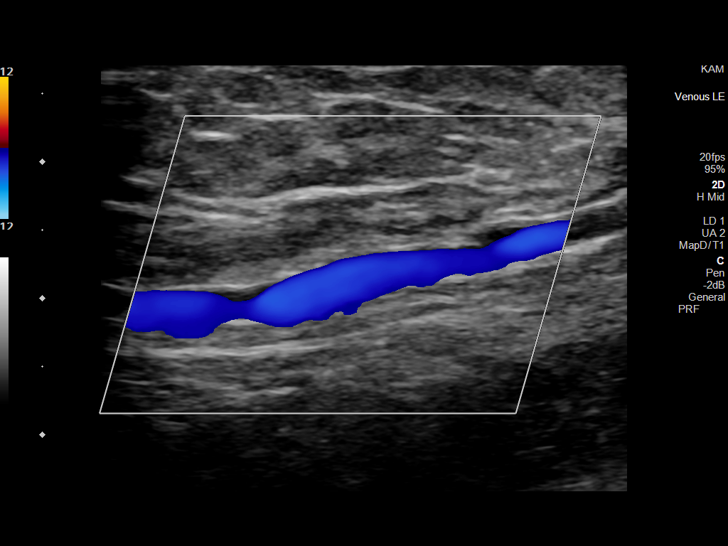
[im 41/53]
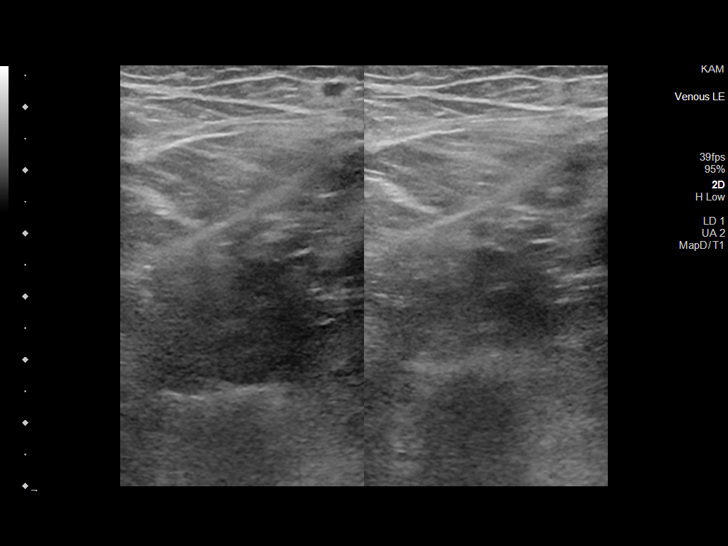
[im 43/53]
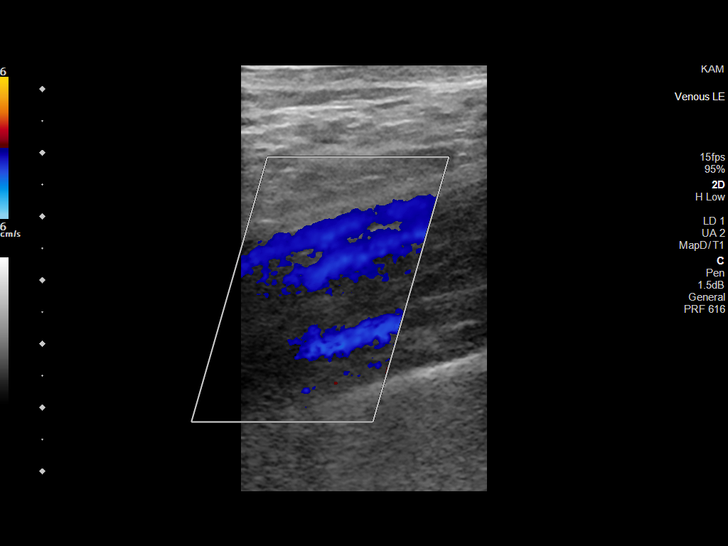
[im 48/53]
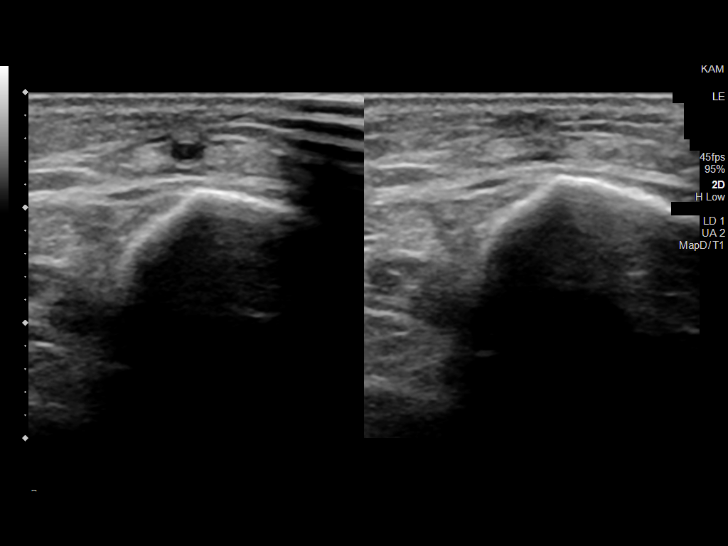
[im 53/53]
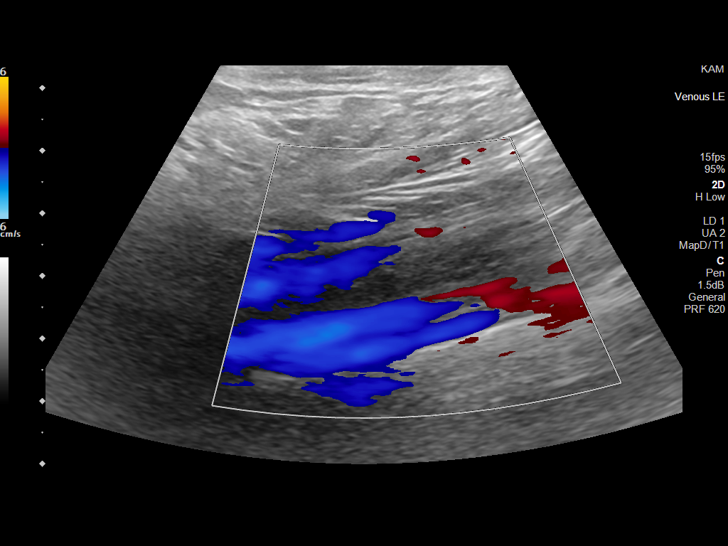

[14 of 24 positions shown; findings below may reference images not displayed]

FINDINGS: VENOUS

Normal compressibility of the common femoral, superficial femoral,
and popliteal veins, as well as the visualized calf veins.
Visualized portions of profunda femoral vein and great saphenous
vein unremarkable. No filling defects to suggest DVT on grayscale or
color Doppler imaging. Doppler waveforms show normal direction of
venous flow, normal respiratory plasticity and response to
augmentation.

Limited views of the contralateral common femoral vein are
unremarkable.

OTHER

None.

Limitations: none
IMPRESSION: Negative.

## 2022-05-21 ENCOUNTER — Ambulatory Visit
Admission: EM | Admit: 2022-05-21 | Discharge: 2022-05-21 | Disposition: A | Discharge status: Home / Self Care | Payer: 59

## 2022-05-21 ENCOUNTER — Other Ambulatory Visit: Payer: Self-pay

## 2022-05-21 ENCOUNTER — Encounter: Payer: Self-pay | Admitting: Emergency Medicine

## 2022-05-21 DIAGNOSIS — R040 Epistaxis: Secondary | ICD-10-CM

## 2022-05-21 NOTE — Discharge Instructions (Signed)
Follow up with your primary care or ENT provider if your symptoms are worsening or not improving.    

## 2022-05-21 NOTE — ED Provider Notes (Signed)
Roderic Palau    CSN: [id number] Arrival date & time: 05/21/22  1121      History   Chief Complaint Chief Complaint  Patient presents with   Epistaxis    HPI Kristy Vasquez is a 52 y.o. female.    Epistaxis   Presents to urgent care after Rhino Rocket placed in urgent care 2 days ago.  Requests removal of same.  Past Medical History:  Diagnosis Date   Hypertension 2013   Obesity    Stroke [facility]) 2013   hemorrhagic stroke    Patient Active Problem List   Diagnosis Date Noted   Hyperlipemia 11/25/2018   Cytotoxic brain edema ([place]) 11/23/2018   Cerebral hemorrhage ([place]) - hypertensive L thalamic/basal ganglia ICH 08/30/2011   Accelerated hypertension 08/30/2011   Obesity, Class III, BMI 40-49.9 (morbid obesity) ([place]) 08/30/2011    Past Surgical History:  Procedure Laterality Date   NOVASURE ABLATION     TUBAL LIGATION      OB History   No obstetric history on file.      Home Medications    Prior to Admission medications   Medication Sig Start Date End Date Taking? Authorizing Provider  amLODIPine-Valsartan-HCTZ 10-320-25 MG TABS Take 1 tablet by mouth daily. 07/03/21     atorvastatin (LIPITOR) 40 MG tablet Take 1 tablet (40 mg total) by mouth daily at 6 PM. 11/25/18   Donzetta Starch, NP  COVID-19 mRNA Vac-TriS, Pfizer, (PFIZER-BIONT COVID-19 VAC-TRIS) SUSP injection Inject into the muscle. 10/25/20   Carlyle Basques, MD  NOREL AD 4-10-325 MG TABS Take 1 tablet by mouth every 6 (six) hours as needed (for allergies).  10/03/18   [provider]  Olmesartan-amLODIPine-HCTZ 40-10-25 MG TABS Take 1 tablet by mouth daily. 10/02/18   [provider]  Semaglutide,0.25 or 0.5MG /DOS, (OZEMPIC, 0.25 OR 0.5 MG/DOSE,) 2 MG/1.5ML SOPN Inject 0.25 mg into the skin once a week. 08/03/21     Semaglutide,0.25 or 0.5MG /DOS, (OZEMPIC, 0.25 OR 0.5 MG/DOSE,) 2 MG/3ML SOPN Inject 0.25 mg into the skin once a week. 06/30/21     spironolactone (ALDACTONE) 25 MG  tablet Take 1 tablet (25 mg total) by mouth daily. 11/26/18   Donzetta Starch, NP  Vitamin D, Ergocalciferol, (DRISDOL) 1.25 MG (50000 UT) CAPS capsule Take 50,000 Units by mouth every Sunday.  10/21/18   [provider]    Family History Family History  Problem Relation Age of Onset   Cancer Other    Diabetes Other     Social History Social History   Tobacco Use   Smoking status: Never   Smokeless tobacco: Never  Vaping Use   Vaping Use: Never used  Substance Use Topics   Alcohol use: Yes   Drug use: No     Allergies   Patient has no known allergies.   Review of Systems Review of Systems  HENT:  Positive for nosebleeds.      Physical Exam Triage Vital Signs ED Triage Vitals [05/21/22 1146]  Enc Vitals Group     BP      Pulse      Resp      Temp      Temp src      SpO2      Weight      Height      Head Circumference      Peak Flow      Pain Score 0     Pain Loc      Pain  Edu?      Excl. in [place]?    No data found.  Updated Vital Signs There were no vitals taken for this visit.  Visual Acuity Right Eye Distance:   Left Eye Distance:   Bilateral Distance:    Right Eye Near:   Left Eye Near:    Bilateral Near:     Physical Exam Vitals reviewed.  Constitutional:      Appearance: Normal appearance.  HENT:     Nose:   Skin:    General: Skin is warm.  Neurological:     General: No focal deficit present.     Mental Status: She is alert and oriented to person, place, and time.  Psychiatric:        Mood and Affect: Mood normal.        Behavior: Behavior normal.      UC Treatments / Results  Labs (all labs ordered are listed, but only abnormal results are displayed) Labs Reviewed - No data to display  EKG   Radiology No results found.  Medications Ordered in UC Medications - No data to display  Initial Impression / Assessment and Plan / UC Course  I have reviewed the triage vital signs and the nursing notes.  Pertinent  labs & imaging results that were available during my care of the patient were reviewed by me and considered in my medical decision making (see chart for details).   No bleeding from nares is present on presentation to urgent care.  Gentle irrigation to sponge was performed using normal saline syringe.  Gently withdrew nasal sponge from her right naris without issue.  Well-tolerated.  No bleeding present after procedure.   Final Clinical Impressions(s) / UC Diagnoses   Final diagnoses:  None   Discharge Instructions   None    ED Prescriptions   None    PDMP not reviewed this encounter.   Rose Phi, FNP 05/21/22 1200

## 2022-05-21 NOTE — ED Triage Notes (Signed)
[place] urgent care placed rhinorocket in right nare. Equipment placed on Saturday.

## 2022-12-13 ENCOUNTER — Other Ambulatory Visit: Payer: Self-pay | Admitting: Nurse Practitioner

## 2022-12-13 ENCOUNTER — Ambulatory Visit: Payer: Self-pay

## 2022-12-13 DIAGNOSIS — M25521 Pain in right elbow: Secondary | ICD-10-CM

## 2022-12-13 DIAGNOSIS — M25562 Pain in left knee: Secondary | ICD-10-CM

## 2022-12-13 DIAGNOSIS — M25561 Pain in right knee: Secondary | ICD-10-CM
